# Patient Record
Sex: Male | Born: 1970 | Race: Black or African American | Hispanic: No | Marital: Married | Smoking: Never smoker
Health system: Southern US, Community
[De-identification: ages and names within clinical notes are randomized; demographics above are authoritative.]

## PROBLEM LIST (undated history)

## (undated) DIAGNOSIS — M109 Gout, unspecified: Secondary | ICD-10-CM

## (undated) DIAGNOSIS — C801 Malignant (primary) neoplasm, unspecified: Secondary | ICD-10-CM

## (undated) DIAGNOSIS — I10 Essential (primary) hypertension: Secondary | ICD-10-CM

## (undated) DIAGNOSIS — H60339 Swimmer's ear, unspecified ear: Secondary | ICD-10-CM

## (undated) HISTORY — PX: APPENDECTOMY: SHX54

## (undated) HISTORY — DX: Essential (primary) hypertension: I10

## (undated) HISTORY — PX: KELOID EXCISION: SHX1856

---

## 1998-09-13 ENCOUNTER — Emergency Department (HOSPITAL_COMMUNITY): Admission: EM | Admit: 1998-09-13 | Discharge: 1998-09-13 | Payer: Self-pay | Admitting: Emergency Medicine

## 2005-02-20 ENCOUNTER — Ambulatory Visit: Payer: Self-pay | Admitting: Internal Medicine

## 2006-01-30 ENCOUNTER — Ambulatory Visit: Payer: Self-pay | Admitting: Internal Medicine

## 2007-02-18 ENCOUNTER — Ambulatory Visit: Payer: Self-pay | Admitting: Internal Medicine

## 2007-03-28 ENCOUNTER — Emergency Department (HOSPITAL_COMMUNITY): Admission: EM | Admit: 2007-03-28 | Discharge: 2007-03-28 | Payer: Self-pay | Admitting: Emergency Medicine

## 2007-04-20 ENCOUNTER — Ambulatory Visit: Payer: Self-pay | Admitting: Internal Medicine

## 2008-02-17 DIAGNOSIS — J3089 Other allergic rhinitis: Secondary | ICD-10-CM

## 2008-02-17 DIAGNOSIS — J302 Other seasonal allergic rhinitis: Secondary | ICD-10-CM | POA: Insufficient documentation

## 2008-03-01 ENCOUNTER — Encounter: Payer: Self-pay | Admitting: Internal Medicine

## 2008-03-30 ENCOUNTER — Ambulatory Visit: Payer: Self-pay | Admitting: Internal Medicine

## 2008-03-30 DIAGNOSIS — IMO0002 Reserved for concepts with insufficient information to code with codable children: Secondary | ICD-10-CM | POA: Insufficient documentation

## 2008-04-07 ENCOUNTER — Telehealth: Payer: Self-pay | Admitting: Internal Medicine

## 2009-05-09 ENCOUNTER — Ambulatory Visit: Payer: Self-pay | Admitting: Internal Medicine

## 2009-05-18 DIAGNOSIS — I10 Essential (primary) hypertension: Secondary | ICD-10-CM | POA: Insufficient documentation

## 2010-02-20 ENCOUNTER — Telehealth: Payer: Self-pay | Admitting: Internal Medicine

## 2010-02-27 ENCOUNTER — Ambulatory Visit: Payer: Self-pay | Admitting: Internal Medicine

## 2010-03-29 ENCOUNTER — Ambulatory Visit: Payer: Self-pay | Admitting: Internal Medicine

## 2010-09-26 ENCOUNTER — Telehealth: Payer: Self-pay | Admitting: Internal Medicine

## 2010-09-27 ENCOUNTER — Telehealth: Payer: Self-pay | Admitting: Internal Medicine

## 2010-09-28 ENCOUNTER — Ambulatory Visit: Payer: Self-pay | Admitting: Internal Medicine

## 2010-12-19 ENCOUNTER — Encounter
Admission: RE | Admit: 2010-12-19 | Discharge: 2010-12-19 | Payer: Self-pay | Source: Home / Self Care | Attending: Internal Medicine | Admitting: Internal Medicine

## 2011-01-31 NOTE — Assessment & Plan Note (Signed)
Summary: rov/jd   Primary Provider/Referring Provider:  Dossie Arbour  CC:  Follow up visit-allergic Rhinitis.  History of Present Illness: 05/09/09- Allergic rhinitis Needs refill nasonex. This has  been sufficient with zyrtec during heavy pollen season. Denies headache or wheezing, purulent or bloody discharge, ear ache or sore throat. Scheduling misunerstanding today.  February 27, 2010- Allergic rhinitis He finds that running outdoors for exercise rapidly brings on nasal congestion and blowing, without chest tightness or wheeze. Eyes itch. He is using nasal saline every day- this prevents sinus infections.  He has been out of nasonex all winter.. Zyrtec makes him sleepy. He had finsihed allergy vaccine n 2008.  March 29, 2010- Allergic rhinitis Not doing as well as he hoped with nasal congestion which is worst outside and especially bothersome when he tries to run for exercise. Nasonex is being used regularly but hasn't added much. Failed Singulair.  Not using Astepro. Not taking BP med reliably- no difference on his nose.  September 28, 2010 Allergic rhinitis Doing better- rhinitis is better and he is able to do his running outdoors now. BP med was changed from [place] to [place]. He continues Nasonex and Astepro.  They are trying to get pregnant and there was concern about drying meds. He is of Zyrtec, using Astepro once daily. Wants flu vax. He does not get asthma, chest tightness or wheeze.   Preventive Screening-Counseling & Management  Alcohol-Tobacco     Smoking Status: never  Current Medications (verified): 1)  Nasonex 50 Mcg/act  Susp (Mometasone Furoate) .... Two Puffs Each Nostril Daily 2)  Zyrtec Allergy 10 Mg Tabs (Cetirizine Hcl) .Marland Kitchen.. 1 Once Daily and As Needed 3)  Norvasc 5 Mg Tabs (Amlodipine Besylate) .... Take 1 By Mouth Once Daily 4)  Astepro 0.15 % Soln (Azelastine Hcl) .Marland Kitchen.. 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   Risk Factors: Smoking Status: never (09/28/2010)  Review of Systems      See HPI       The patient complains of nasal congestion/difficulty breathing through nose and sneezing.  The patient denies shortness of breath with activity, shortness of breath at rest, productive cough, non-productive cough, coughing up blood, chest pain, irregular heartbeats, acid heartburn, indigestion, loss of appetite, weight change, abdominal pain, difficulty swallowing, sore throat, tooth/dental problems, headaches, itching, depression, hand/feet swelling, joint stiffness or pain, rash, change in color of mucus, and fever.    Vital Signs:  Patient profile:   40 year old male Height:      72 inches Weight:      225.25 pounds BMI:     30.66 O2 Sat:      98 % on Room air Pulse rate:   79 / minute BP sitting:   120 / 78  (right arm) Cuff size:   regular  Vitals Entered By: Reynaldo Minium CMA (September 28, 2010 4:02 PM)  O2 Flow:  Room air CC: Follow up visit-allergic Rhinitis   Physical Exam  Additional Exam:  General: A/Ox3; pleasant and cooperative, NAD,  SKIN: no rash, lesions NODES: no lymphadenopathy HEENT: [place]/AT, EOM- WNL, Conjuctivae- clear, PERRLA, TM-WNL, Nose- mucosa normal minor septal deviation, no polyps seen., Throat- clear  and wnl, Mallampati  II NECK: Supple w/ fair ROM, JVD- none, normal carotid impulses w/o bruits Thyroid-  CHEST: Clear to P&A HEART: RRR, no m/g/r heard ABDOMEN: Soft and nl;  ZOX:WRUE, nl pulses, no edema  NEURO: Grossly intact to observation      Impression & Recommendations:  Problem # 1:   ALLERGIC RHINITIS (ICD-477.9)  He can try reducing Astepro to once daily in alternate nostrils and skip it when not needed as suits him. This will minimize drying for now, leaving flexibility for seasonal needs.  Flu vax discussed.  His updated medication list for this problem includes:    Nasonex 50 Mcg/act Susp (Mometasone furoate) .Marland Kitchen..Marland Kitchen Two puffs each nostril daily    Zyrtec Allergy 10 Mg Tabs (Cetirizine hcl) .Marland Kitchen... 1 once daily and as needed    Astepro 0.15 % Soln (Azelastine hcl) .Marland Kitchen... 1-2 puffs up to twice daily antihistamine  Medications Added to Medication List This Visit: 1)  Norvasc 5 Mg Tabs (Amlodipine besylate) .... Take 1 by mouth once daily  Other Orders: Est. Patient Level III (45409)  Patient Instructions: 1)  Please schedule a follow-up appointment in 1 year. 2)  Astepro can be used intermittently on an as- needed basis as you find useful, or you try taking it in alternating nostrils ever other day etc, to find the lowest dose that works for you.  3)  Call for refills when needed 4)  Flu vax 5)  Samples of Astepro and Nasonex

## 2011-01-31 NOTE — Assessment & Plan Note (Signed)
Summary: rov 1 month///kp   Primary Provider/Referring Provider:  Dossie Arbour  CC:  1 month follow up visit.  History of Present Illness: History of Present Illness: 4/09- This gentleman quit allergy vaccine over a year ago.  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  He had only one cold last winter and says his breathing is comfortable when he is outside.  He continues using Nasonex nasal spray, which seemed to work well for him.  Today he feels mild nasal stuffiness with early spring weather.  We discussed available over-the-counter antihistamines like Claritin.  He has not had wheeze, cough, chest congestion, or purulent discharge.  05/09/09- Allergic rhinitis Needs refill nasonex. This has  been sufficient with zyrtec during heavy pollen season. Denies headache or wheezing, purulent or bloody discharge, ear ache or sore throat. Scheduling misunerstanding today.  February 27, 2010- Allergic rhinitis He finds that running outdoors for exercise rapidly brings on nasal congestion and blowing, without chest tightness or wheeze. Eyes itch. He is using nasal saline every day- this prevents sinus infections.  He has been out of nasonex all winter.. Zyrtec makes him sleepy. He had finsihed allergy vaccine n 2008.  March 29, 2010- Allergic rhinitis Not doing as well as he hoped with nasal congestion which is worst outside and especially bothersome when he tries to run for exercise. Nasonex is being used regularly but hasn't added much. Failed Singulair.  Not using Astepro. Not taking BP med reliably- no difference on his nose.   Current Medications (verified): 1)  Nasonex 50 Mcg/act  Susp (Mometasone Furoate) .... Two Puffs Each Nostril Daily 2)  Zyrtec Allergy 10 Mg Tabs (Cetirizine Hcl) .Marland Kitchen.. 1 Once Daily and As Needed 3)  Benazepril-Hydrochlorothiazide 20-12.5 Mg Tabs (Benazepril-Hydrochlorothiazide) .... Take 1 By Mouth Once Daily 4)  Astepro 0.15 % Soln (Azelastine Hcl) .Marland Kitchen.. 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   Risk Factors: Smoking Status: never (03/30/2008)  Review of Systems      See HPI  The patient denies anorexia, fever, weight loss, weight gain, vision loss, decreased hearing, hoarseness, chest pain, syncope, dyspnea on exertion, peripheral edema, prolonged cough, headaches, hemoptysis, and severe indigestion/heartburn.    Vital Signs:  Patient profile:   40 year old male Height:      72 inches Weight:      237.38 pounds BMI:     32.31 O2 Sat:      96 % on Room air Pulse rate:   73 / minute BP sitting:   150 / 78  (left arm) Cuff size:   large  Vitals Entered By: Reynaldo Minium CMA (March 29, 2010 9:48 AM)  O2 Flow:  Room air  Physical Exam  Additional Exam:  General: A/Ox3; pleasant and cooperative, NAD,  SKIN: no rash, lesions NODES: no lymphadenopathy HEENT: [place]/AT, EOM- WNL, Conjuctivae- clear, PERRLA, TM-WNL, Nose- mucosa reddened without eroison minor septal deviation, no polyps seen., Throat- clear and wnl, Mallampati  II NECK: Supple w/ fair ROM, JVD- none, normal carotid impulses w/o bruits Thyroid-  CHEST: Clear to P&A HEART: RRR, no m/g/r heard ABDOMEN: Soft and nl;  EAV:WUJW, nl pulses, no edema  NEURO: Grossly intact  to observation      Impression & Recommendations:  Problem # 1:  ALLERGIC RHINITIS (ICD-477.9)  Not enough anatomic blockage to expect benefit from ENT surgery. He would be a candidate to resume allergy vaccine. We discussed available alternative treatments. He can try cautious use of Afrin, maybe just once daily before  going running during this season. His updated medication list for this problem includes:    Nasonex 50 Mcg/act Susp (Mometasone furoate) .Marland Kitchen..Marland Kitchen Two puffs each nostril daily    Zyrtec Allergy 10 Mg Tabs (Cetirizine hcl) .Marland Kitchen... 1 once daily and as needed    Astepro 0.15 % Soln (Azelastine hcl) .Marland Kitchen... 1-2 puffs up to twice daily antihistamine  Other Orders: Est. Patient Level III (16109)  Patient Instructions: 1)  Please schedule a follow-up appointment in 6 months. 2)  Try cautious, occasional use of Afrin nasal spray as discussed, maybe just once daily before you go running. 3)  BP today was high- 150/78

## 2011-01-31 NOTE — Assessment & Plan Note (Signed)
Summary: George Yang   Primary Provider/Referring Provider:  Leslee Home  CC:  follow up visit-allergies are getting worse now; unable to run due to Increased allergies. .  History of Present Illness: 4/09- This gentleman quit allergy vaccine over a year ago.  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  He had only one cold last winter and says his breathing is comfortable when he is outside.  He continues using Nasonex nasal spray, which seemed to work well for him.  Today he feels mild nasal stuffiness with early spring weather.  We discussed available over-the-counter antihistamines like Claritin.  He has not had wheeze, cough, chest congestion, or purulent discharge.  05/09/09- Allergic rhinitis Needs refill nasonex. This has  been sufficient with zyrtec during heavy pollen season. Denies headache or wheezing, purulent or bloody discharge, ear ache or sore throat. Scheduling misunerstanding today.  February 27, 2010- Allergic rhinitis He finds that running outdoors for exercise rapidly brings on nasal congestion and blowing, without chest tightness or wheeze. Eyes itch. He is using nasal saline every day- this prevents sinus infections.  He has been out of nasonex all winter.. Zyrtec makes him sleepy. He had finsihed allergy vaccine n 2008.   Current Medications (verified): 1)  Nasonex 50 Mcg/act  Susp (Mometasone Furoate) .... Two Puffs Each Nostril Daily 2)  Zyrtec Allergy 10 Mg Tabs (Cetirizine Hcl) .Marland Kitchen.. 1 Once Daily and As Needed 3)  Benazepril-Hydrochlorothiazide 20-12.5 Mg Tabs (Benazepril-Hydrochlorothiazide) .... 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   Risk  Factors: Smoking Status: never (03/30/2008)  Past Surgical History: Appendectomy Keloid  Family History: Father- status unknown Mother living  Review of Systems      See HPI  The patient denies anorexia, fever, weight loss, weight gain, vision loss, decreased hearing, hoarseness, chest pain, syncope, dyspnea on exertion, peripheral edema, prolonged cough, headaches, hemoptysis, abdominal pain, and severe indigestion/heartburn.    Vital Signs:  Patient profile:   40 year old male Height:      72 inches Weight:      232.25 pounds BMI:     31.61 O2 Sat:      97 % on Room air Pulse rate:   67 / minute BP sitting:   136 / 88  (left arm) Cuff size:   regular  Vitals Entered By: Reynaldo Minium CMA (February 27, 2010 2:57 PM)  O2 Flow:  Room air  Physical Exam  Additional Exam:  General: A/Ox3; pleasant and cooperative, NAD,  SKIN: no rash, lesions NODES: no lymphadenopathy HEENT: [place]/AT, EOM- WNL, Conjuctivae- clear, PERRLA, TM-WNL, Nose- mucosa reddened without eroison, Throat- clear and wnl, Mallampati  II NECK: Supple w/ fair ROM, JVD- none, normal carotid impulses w/o bruits Thyroid-  CHEST: Clear to P&A HEART: RRR, no m/g/r heard ABDOMEN: Soft and nl;  JWJ:XBJY, nl pulses, no edema  NEURO: Grossly intact to observation      Impression & Recommendations:  Problem # 1:  ALLERGIC RHINITIS (ICD-477.9)  Rhinitis without asthma. We may need to revisit allergy vaccine.Skin testing in 1999- strong reactions for grass, trees and fall weeds. For now he wants to run otudoors in pollen season. We will try nasal antihistamine and fexofenadine otc. Samples of singulair. His updated medication list  for this problem includes:    Nasonex 50 Mcg/act Susp (Mometasone furoate) .Marland Kitchen..Marland Kitchen Two puffs each nostril daily    Zyrtec Allergy 10 Mg Tabs (Cetirizine hcl) .Marland Kitchen... 1 once daily and as needed    Astepro 0.15 % Soln (Azelastine hcl) .Marland Kitchen... 1-2 puffs up to twice daily  antihistamine  Orders: Est. Patient Level III (21308)  Medications Added to Medication List This Visit: 1)  Benazepril-hydrochlorothiazide 20-12.5 Mg Tabs (Benazepril-hydrochlorothiazide) .... Take 1 by mouth once daily 2)  Singulair 10 Mg Tabs (Montelukast sodium) .Marland Kitchen.. 1 daily 3)  Astepro 0.15 % Soln (Azelastine hcl) .Marland Kitchen.. 1-2 puffs up to twice daily antihistamine  Patient Instructions: 1)  Please schedule a follow-up appointment in 1 month. 2)  Try sample/ script Singulair- one daily for 7 days. If clearly helpful then fill script 3)  Try otc fexofenadine/ Allegra 180 mg daily as an antihistamine alternative to Zyrtec 4)  Refill nasonex- this is for daily maintenance use. 5)  Try sample/script Astepro nasal antihistamine spray: 1-2 puffs each nostril up to twice dialy as needed. Don't use it right on top of nasonex, but you can use it the same day. Prescriptions: ASTEPRO 0.15 % SOLN (AZELASTINE HCL) 1-2 puffs up to twice daily antihistamine  #1 x prn   Entered and Authorized by:   Waymon Budge MD   Signed by:   Waymon Budge MD on 02/27/2010   Method used:   Print then Give to Patient   RxID:   [id number] SINGULAIR 10 MG TABS (MONTELUKAST SODIUM) 1 daily  #30 x prn   Entered and Authorized by:   Waymon Budge MD   Signed by:   Waymon Budge MD on 02/27/2010   Method used:   Print then Give to Patient   RxID:   [id number] NASONEX 50 MCG/ACT  SUSP (MOMETASONE FUROATE) Two puffs each nostril daily  #1 x prn   Entered and Authorized by:   Waymon Budge MD   Signed by:   Waymon Budge MD on 02/27/2010   Method used:   Print then Give to Patient   RxID:   [id number]

## 2011-01-31 NOTE — Progress Notes (Signed)
Summary: note ready   Phone Note Call from Patient Call back at [phone]   Caller: Patient Call For: George Yang Summary of Call: NEED TO PURCHASE OVER THE COUNTER MEDICATION [place] FLEX PROGRAM WON'T ALLOW HIM TO DO IT . NEED A NOTE Initial call taken by: Rickard Patience,  February 20, 2010 9:33 AM  Follow-up for Phone Call        Pt has a flex spendign account which doesn't allow him to purchase OTC medications without a note from the MD. He states he takes claritin and zyrtec OTC for allergies. He states the note needs to say that he needs these medications to treat his medical problems and then he should be able to pay with his flex spending account. Please advise. Carron Curie CMA  February 20, 2010 9:48 AM   Additional Follow-up for Phone Call Additional follow up Details #1::        Letter dictated Additional Follow-up by: Waymon Budge MD,  February 20, 2010 10:11 AM    Additional Follow-up for Phone Call Additional follow up Details #2::    Letter placed in envelope and will will in triage until we get a call back from pt.  Not sure whether to mail or if pt wants to come pick up. LMOMTCB Vernie Murders  February 21, 2010 9:35 AM  pt returned call. wants this letter mailed to him. (I have verified his address). Tivis Ringer, CNA  February 21, 2010 9:39 AM  letter mailed. Carron Curie CMA  February 21, 2010 9:40 AM

## 2011-01-31 NOTE — Progress Notes (Signed)
Summary: nos appt  Phone Note Call from Patient   Caller: [email or web address]  Call For: Sander Remedios Summary of Call: LMTCB x2 to rsc nos from 9/27. Initial call taken by: Darletta Moll,  September 26, 2010 3:32 PM

## 2011-01-31 NOTE — Progress Notes (Signed)
Summary: nos appt  Phone Note Call from Patient   Caller: [email or web address]  Call For: young Summary of Call: Rsc nos from 9/27 to 9/30 @ 3:45p. Initial call taken by: Darletta Moll,  September 27, 2010 9:07 AM

## 2011-05-14 NOTE — Letter (Signed)
February 20, 2010     RE:  DELRICO, MINEHART  MRN:  [id number]  /  DOB:  Mar 19, 1971   To Whom It May Concern:   Mr. Vespa is under my care for allergies.  His medical care is  constrained some by other medical problems.  He uses over-the-counter  antihistamines, Claritin and Zyrtec, for his allergy management and  needs to be able to include these on his necessary medication list.  Please consider including these in the coverage of his Flex medication  program.    Sincerely,      Clinton D. Maple Hudson, MD, Tonny Bollman, FACP  Electronically Signed    CDY/MedQ  DD: 02/20/2010  DT: 02/20/2010  Job #: [id number]

## 2011-05-17 IMAGING — US US SCROTUM
1 series · 14 of 25 positions shown · non-contrast
Comparison: None.

CLINICAL DATA: Right-sided testicular pain.

SCROTAL ULTRASOUND
DOPPLER ULTRASOUND OF THE TESTICLES
TECHNIQUE: Complete ultrasound examination of the testicles,
epididymis, and other scrotal structures was performed.  Color and
spectral Doppler ultrasound were also utilized to evaluate blood
flow to the testicles.

[Series 1: us scrotum · 0.07mm/px · 14 of 74 slices shown]
[im 1/74]
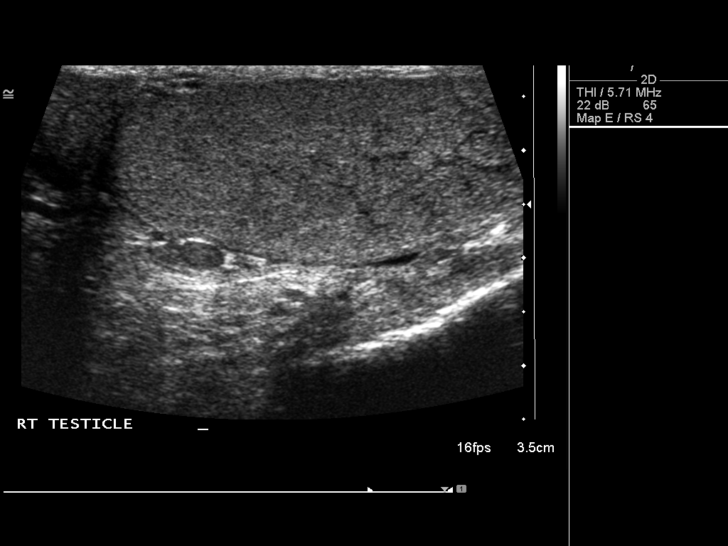
[im 7/74]
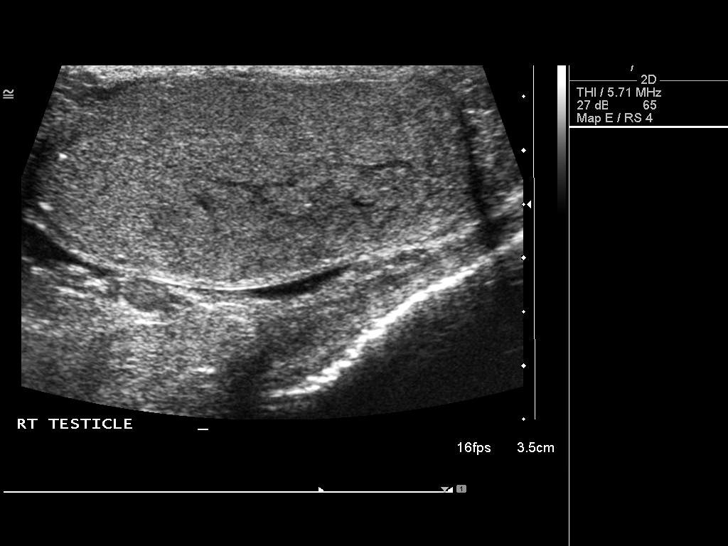
[im 13/74]
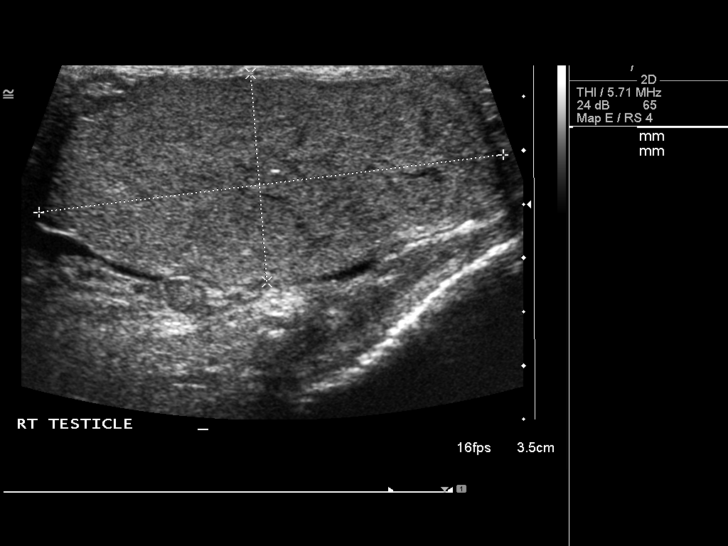
[im 19/74]
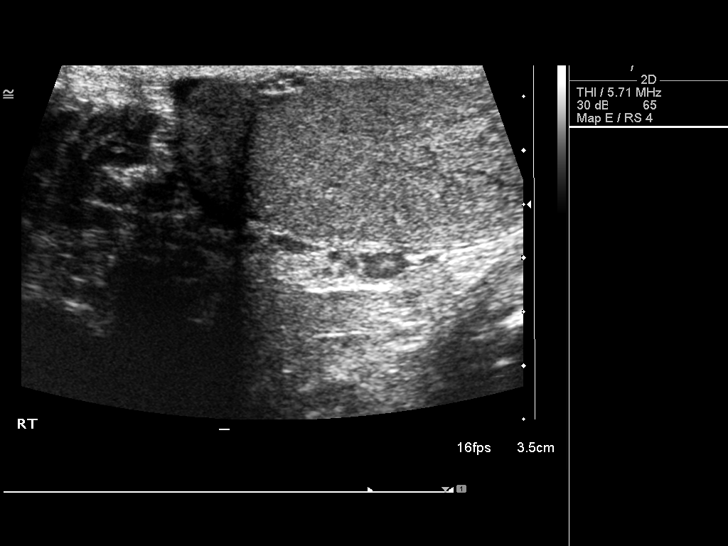
[im 25/74]
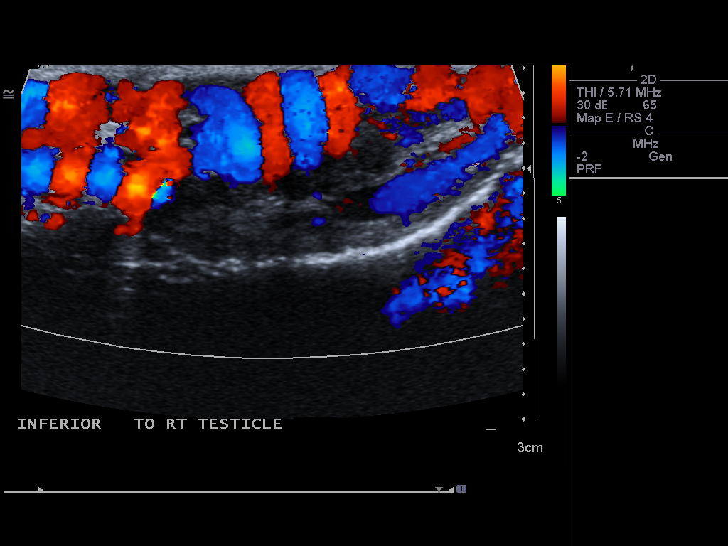
[im 28/74]
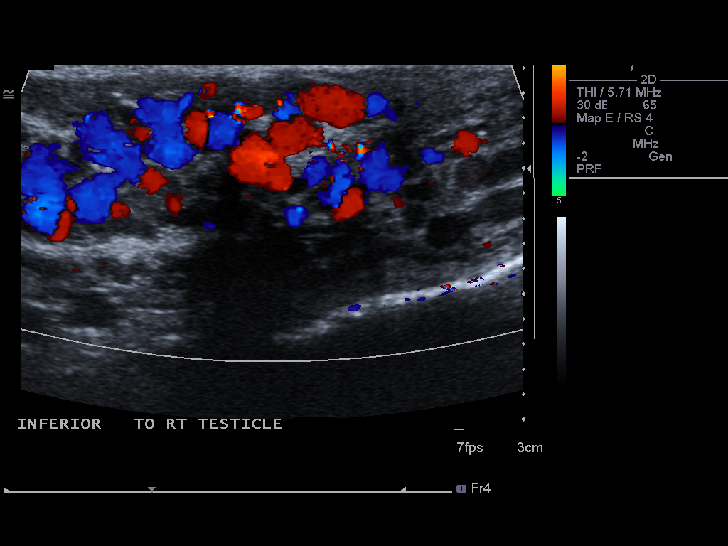
[im 34/74]
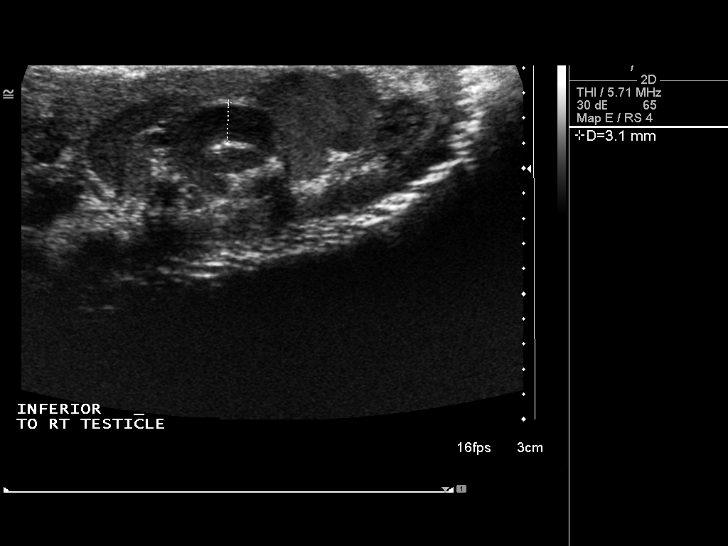
[im 40/74]
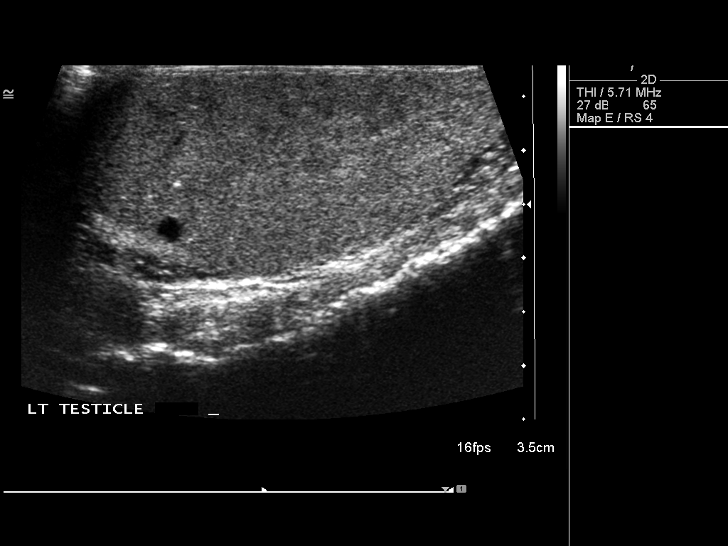
[im 46/74]
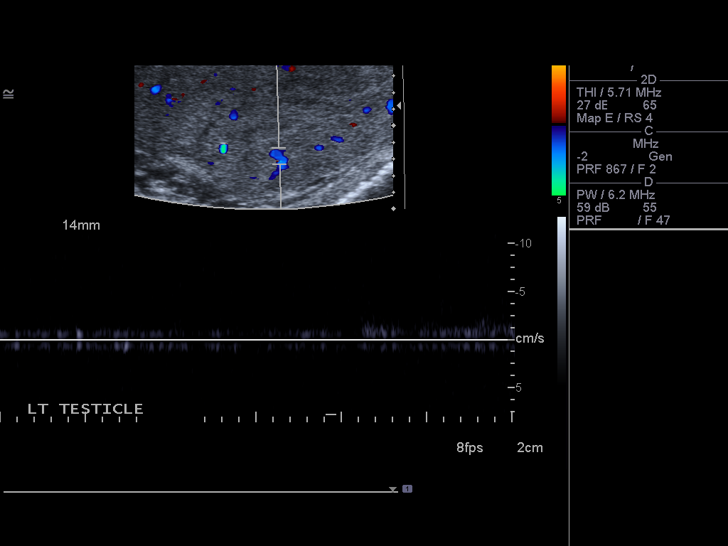
[im 49/74]
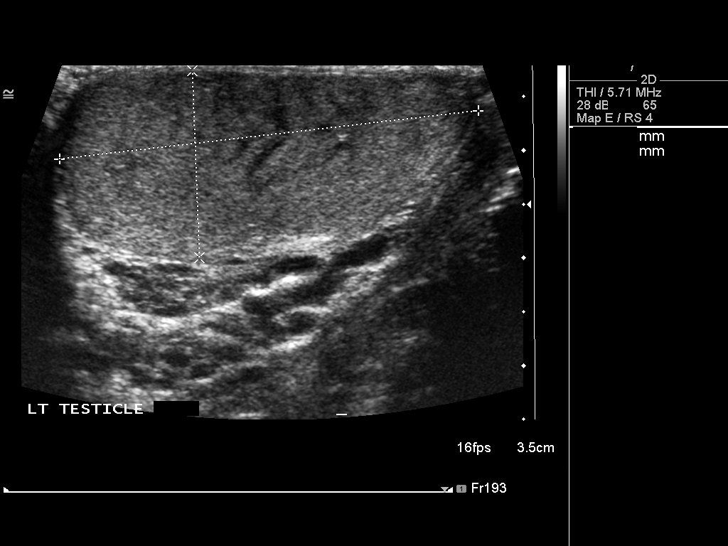
[im 55/74]
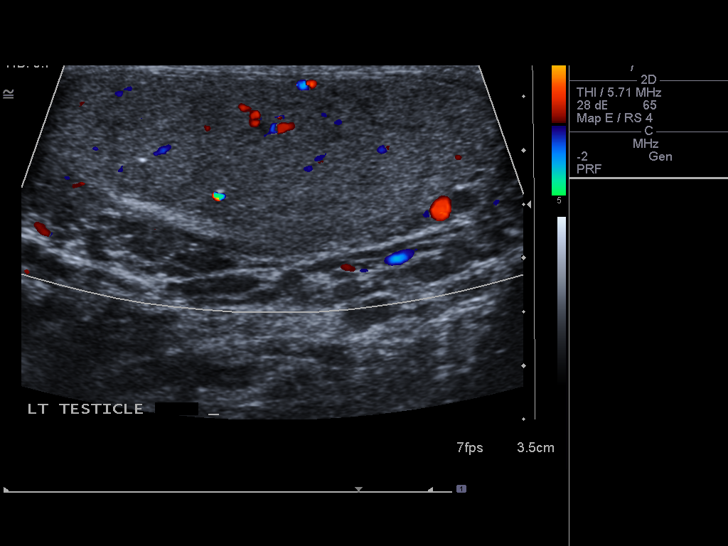
[im 61/74]
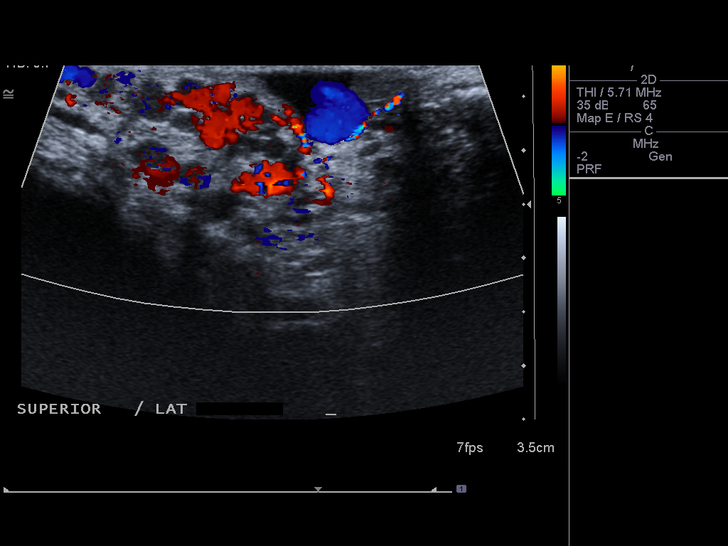
[im 67/74]
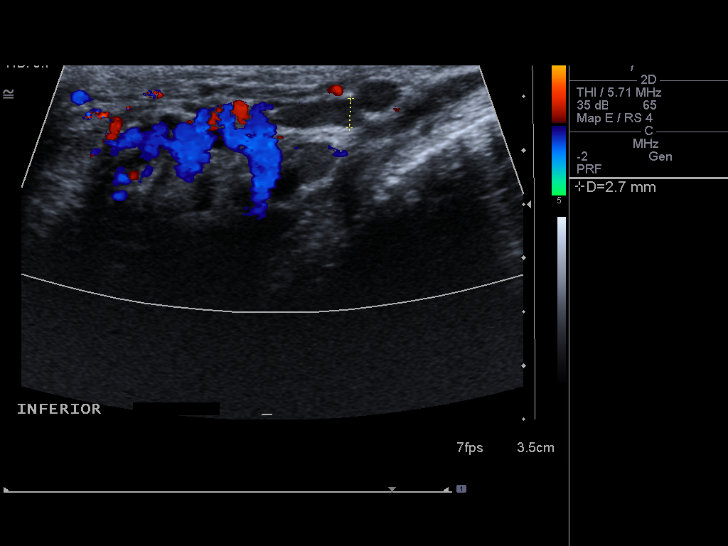
[im 74/74]
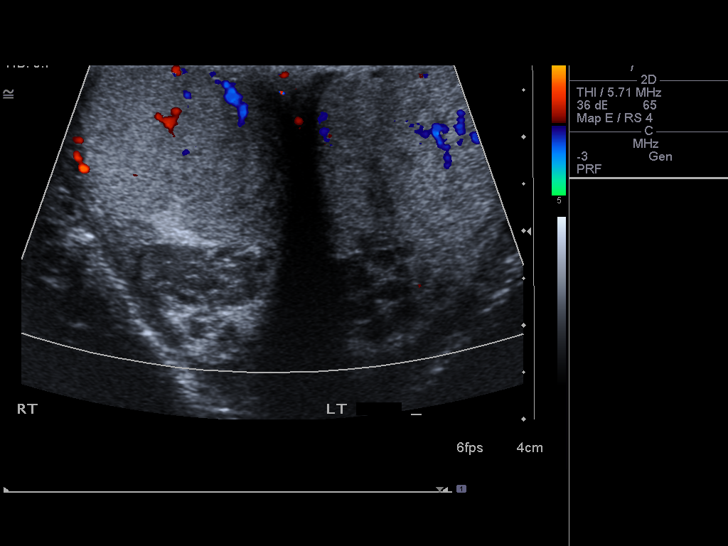

[14 of 25 positions shown; findings below may reference images not displayed]

FINDINGS: Right testicle measures 4.4 x 1.9 x 2.4 cm and left
testicle, 3.9 x 1.7 x 2.6 cm.  Parenchymal echo texture is mildly
heterogeneous bilaterally.  Color Doppler flow and arterial and
venous Doppler waveforms are identified bilaterally.  A 3 mm cyst
is seen peripherally in the left testicle, and is likely a small
tunica albuginea cyst.

Epididymi are unremarkable bilaterally.  No hydrocele.  There are
bilateral varicoceles.
IMPRESSION: 1.  Symmetric testicular parenchymal heterogeneity may be due to
edema.  No evidence of torsion or mass.  Follow-up in 4-6 weeks may
be helpful in further evaluation, as clinically indicated.
2.  Bilateral varicoceles.

## 2011-05-17 NOTE — Assessment & Plan Note (Signed)
[facility]                             PULMONARY OFFICE NOTE   GOLDEN, GILREATH                   MRN:          [id number]  DATE:02/18/2007                            DOB:          1971-06-12    PROBLEM:  Allergic rhinitis.   HISTORY:  One year followup for this non-smoking man, who says it was a  good year until he had a recent cold. He is mostly over that. Ears  occasionally itch. He does expect increased nasal congestion as the  spring season comes in and he needs his medications refilled. He does  not wheeze and has not had purulent discharge. He had quit allergy  vaccine about 4 years ago.   MEDICATIONS:  1. Benazepril.  2. Allegra 60 mg b.i.d. p.r.n.  3. Nasonex, used at intervals.   OBJECTIVE:  Weight 221 pounds, blood pressure 132/82, pulse regular 69,  room air saturation 99%. There is only mild nasal puffiness now with no  significant discharge. No post-nasal drainage. No adenopathy.  Conjunctivae are clear. Lungs are clear. Heart sounds are regular  without murmur. He does not cough and denies cough or hives. We  discussed his ACE inhibitor, which he seems to be tolerating well.   IMPRESSION:  Allergic rhinitis, currently having a good year.   PLAN:  1. Change Allegra to 180 mg 1 daily p.r.n.  2. Refill Nasonex for one spray in each nostril during the seasons of      need.  3. Return 1 year, earlier p.r.n.     Clinton D. Maple Hudson, MD, Tonny Bollman, FACP  Electronically Signed    CDY/MedQ  DD: 02/18/2007  DT: 02/19/2007  Job #: [id number]   cc:   Reuben Likes, M.D.

## 2011-05-17 NOTE — Assessment & Plan Note (Signed)
[facility]                             PULMONARY OFFICE NOTE   ROLLAN, ROGER                   MRN:          [id number]  DATE:04/20/2007                            DOB:          1971-09-17    PROBLEM:  Allergic rhinitis.   HISTORY:  Mr. Bedoy has been off of allergy vaccine now several  years.  He says he went running for exercise 4-5 days ago, and the  pollen got to him, causing marked sneezing, nasal congestion, and  drainage.  Fexofenadine is not holding, and he asks for steroid therapy  based on past experience.  He has seen nothing infected, no headache, no  wheeze.   MEDICATIONS:  1. Nasonex.  2. Benazepril.  3. Fexofenadine 180 mg.   OBJECTIVE:  VITAL SIGNS: Weight 221 pounds, pulse regular at 91.  Room  air saturation 96%.  HEENT: There is marked bilateral nasal congestion with clear bubbly  secretion, pale mucosa, slight periorbital puffiness, watery eyes.  LUNGS:  Cough without wheeze.   IMPRESSION:  Acute exacerbation of allergic rhinitis.   PLAN:  1. We discussed antihistamine alternatives including Claritin or      Zyrtec.  2. Neo-Synephrine inhalation, Depo-Medrol 80 mg IM.  3. Prednisone taper from 40 mg with steroid talk.  4. Schedule return in one year but earlier p.r.n.  5. Environmental precautions where practical.  6. Consider allergy retest p.r.n.     Clinton D. Maple Hudson, MD, Tonny Bollman, FACP  Electronically Signed    CDY/MedQ  DD: 04/26/2007  DT: 04/26/2007  Job #: [id number]   cc:   Reuben Likes, M.D.

## 2011-08-15 ENCOUNTER — Other Ambulatory Visit: Payer: Self-pay | Admitting: Internal Medicine

## 2011-08-16 ENCOUNTER — Other Ambulatory Visit: Payer: Self-pay | Admitting: *Deleted

## 2011-08-16 MED ORDER — MOMETASONE FUROATE 50 MCG/ACT NA SUSP: 2.0000 | Freq: Every day | NASAL | Status: DC

## 2011-09-25 ENCOUNTER — Encounter: Payer: Self-pay | Admitting: Internal Medicine

## 2011-09-27 ENCOUNTER — Ambulatory Visit: Payer: Self-pay | Admitting: Internal Medicine

## 2011-10-01 ENCOUNTER — Ambulatory Visit: Payer: Self-pay | Admitting: Internal Medicine

## 2011-10-02 ENCOUNTER — Ambulatory Visit (INDEPENDENT_AMBULATORY_CARE_PROVIDER_SITE_OTHER): Payer: 59 | Admitting: Internal Medicine

## 2011-10-02 ENCOUNTER — Encounter: Payer: Self-pay | Admitting: Internal Medicine

## 2011-10-02 VITALS — BP 136/82 | HR 81 | Ht 72.0 in | Wt 228.6 lb

## 2011-10-02 DIAGNOSIS — Z23 Encounter for immunization: Secondary | ICD-10-CM

## 2011-10-02 DIAGNOSIS — J309 Allergic rhinitis, unspecified: Secondary | ICD-10-CM

## 2011-10-02 MED ORDER — AZELASTINE HCL 0.15 % NA SOLN: 1.0000 | Freq: Two times a day (BID) | NASAL | Status: DC

## 2011-10-02 NOTE — Patient Instructions (Signed)
Flu vax   Astepro was refilled-sent  Please call as needed

## 2011-10-02 NOTE — Progress Notes (Signed)
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 Complicated by hypertension. Last here 09/28/2010 In December he had an episode of nasal congestion he interpreted as "allergy" but that resolved. He has been able to for exercise, which had been an issue at one point in the past. He denies sinus pain or drainage wheeze or cough. Nasonex and as to pro are sufficient, with occasional Zyrtec. He sees no reason to change. Would like flu shot.   ROS- Constitutional:   No-   weight loss, night sweats, fevers, chills, fatigue, lassitude. HEENT:   No-  headaches, difficulty swallowing, tooth/dental problems, sore throat,       No-  sneezing, itching, ear ache, little-nasal congestion, post nasal drip,  CV:  No-   chest pain, orthopnea, PND, swelling in lower extremities, anasarca, dizziness, palpitations Resp: No-   shortness of breath with exertion or at rest.              No-   productive cough,  No non-productive cough,  No-  coughing up of blood.              No-   change in color of mucus.  No- wheezing.   Skin: No-   rash or lesions. GI:  No-   heartburn, indigestion, abdominal pain, nausea, vomiting, diarrhea,                 change in bowel habits, loss of appetite GU: No-   dysuria, change in color of urine, no urgency or frequency.  No- flank pain. MS:  No-   joint pain or swelling.  No- decreased range of motion.  No- back pain. Neuro-  Psych:  No- change in mood or affect. No depression or anxiety.  No memory loss.   OBJ- General- Alert, Oriented, Affect-appropriate, Distress- none acute Skin- rash-none, lesions- none, excoriation- none Lymphadenopathy- none Head- atraumatic            Eyes- Gross vision intact, PERRLA, conjunctivae clear secretions            Ears- Hearing, canals-normal            Nose- turbinates look puffy but not obstructive, no-Septal dev, mucus, polyps, erosion, perforation             Throat-  Mallampati III , mucosa clear , drainage- none, tonsils- atrophic Neck- flexible , trachea midline, no stridor , thyroid nl, carotid no bruit Chest - symmetrical excursion , unlabored           Heart/CV- RRR , no murmur , no gallop  , no rub, nl s1 s2                           - JVD- none , edema- none, stasis changes- none, varices- none           Lung- clear to P&A, wheeze- none, cough- none , dullness-none, rub- none           Chest wall-  Abd- tender-no, distended-no, bowel sounds-present, HSM- no Br/ Gen/ Rectal- Not done, not indicated Extrem- cyanosis- none, clubbing, none, atrophy- none, strength- nl Neuro- grossly intact to observation

## 2011-10-03 ENCOUNTER — Ambulatory Visit: Payer: Self-pay | Admitting: Internal Medicine

## 2011-10-05 NOTE — Assessment & Plan Note (Signed)
Adequate control with medications and a little care for environmental precautions. He is satisfied. We're refilling current meds. Flu vaccine.

## 2012-02-25 ENCOUNTER — Other Ambulatory Visit: Payer: Self-pay | Admitting: Internal Medicine

## 2012-04-05 ENCOUNTER — Other Ambulatory Visit: Payer: Self-pay | Admitting: Internal Medicine

## 2012-04-07 ENCOUNTER — Telehealth: Payer: Self-pay | Admitting: Internal Medicine

## 2012-04-07 MED ORDER — PREDNISONE 20 MG PO TABS: 20.0000 mg | ORAL_TABLET | Freq: Every day | ORAL | Status: AC

## 2012-04-07 MED ORDER — MOMETASONE FUROATE 50 MCG/ACT NA SUSP: NASAL | Status: DC

## 2012-04-07 NOTE — Telephone Encounter (Signed)
I spoke with pt and is aware of CDY recs. He voiced his understanding and had no questions 

## 2012-04-07 NOTE — Telephone Encounter (Signed)
I spoke with pt and he c/o severe runny nose and PND x Saturday after he ran a hal of marathon, denies any cough, sob, wheezing, chest tx, f/c/n/v, no sinus pressure, ear pain. Pt is taking afrin OTC, nasonex, astepro and zyrtec daily. Pt was wanting to come in just for a depo injection and not an OV. Please advise dr. Maple Hudson, thanks  No Known Allergies

## 2012-04-07 NOTE — Telephone Encounter (Signed)
Per CY-give patient Prednisone 20 mg #5 take 1 po qd-we are not allowed to do just injection visits like that anymore. If no better pt will need to make appt to be seen for this.

## 2012-05-26 ENCOUNTER — Telehealth: Payer: Self-pay | Admitting: Internal Medicine

## 2012-05-26 MED ORDER — AZITHROMYCIN 250 MG PO TABS: ORAL_TABLET | ORAL | Status: DC

## 2012-05-26 NOTE — Telephone Encounter (Signed)
Spoke with pt. He is c/o increased chest congestion x 1 month. He has prod cough also with moderate clear sputum, sometimes has a tinge of yellow. Denies f/c/s, wheeze, CP, SOB. Please advise recs thanks! No Known Allergies

## 2012-05-26 NOTE — Telephone Encounter (Signed)
  Per CY zpak and musinex DM Pt aware and rx sent to [facility] [address]

## 2012-10-01 ENCOUNTER — Encounter: Payer: Self-pay | Admitting: Internal Medicine

## 2012-10-01 ENCOUNTER — Ambulatory Visit (INDEPENDENT_AMBULATORY_CARE_PROVIDER_SITE_OTHER): Payer: 59 | Admitting: Internal Medicine

## 2012-10-01 VITALS — BP 134/68 | HR 65 | Ht 72.0 in | Wt 220.0 lb

## 2012-10-01 DIAGNOSIS — J309 Allergic rhinitis, unspecified: Secondary | ICD-10-CM

## 2012-10-01 MED ORDER — MOMETASONE FUROATE 50 MCG/ACT NA SUSP: NASAL | Status: DC

## 2012-10-01 NOTE — Patient Instructions (Addendum)
Ok to continue present meds  Refill script sent for nasonex  Please call as needed

## 2012-10-01 NOTE — Progress Notes (Signed)
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 Complicated by hypertension. Last here 09/28/2010 In December he had an episode of nasal congestion he interpreted as "allergy" but that resolved. He has been able to for exercise, which had been an issue at one point in the past. He denies sinus pain or drainage wheeze or cough. Nasonex and as to pro are sufficient, with occasional Zyrtec. He sees no reason to change. Would like flu shot.  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 Complicated by hypertension. Has not had any flare ups lately and stays on allergy meds. Has run a half marathon since last here. Had flu vaccine. He is very satisfied with his status.  ROS- see HPI Constitutional:   No-   weight loss, night sweats, fevers, chills, fatigue, lassitude. HEENT:   No-  headaches, difficulty swallowing, tooth/dental problems, sore throat,       No-  sneezing, itching, ear ache, little-nasal congestion, post nasal drip,  CV:  No-   chest pain, orthopnea, PND, swelling in lower extremities, anasarca, dizziness, palpitations Resp: No-   shortness of breath with exertion or at rest.              No-   productive cough,  No non-productive cough,  No-  coughing up of blood.              No-   change in color of mucus.  No- wheezing.   Skin: No-   rash or lesions. GI:  No-   heartburn, indigestion, abdominal pain, nausea, vomiting, GU:  MS:  No-   joint pain or swelling.   Neuro-  Psych:  No- change in mood or affect. No depression or anxiety.  No memory loss.  OBJ- General- Alert, Oriented, Affect-appropriate, Distress- none acute Skin- rash-none, lesions- none, excoriation- none Lymphadenopathy- none Head- atraumatic            Eyes- Gross vision intact, PERRLA, conjunctivae clear secretions            Ears- Hearing, canals-normal            Nose- turbinates  look puffy but not obstructive, no-Septal dev, mucus, polyps, erosion, perforation             Throat- Mallampati III , mucosa clear , drainage- none, tonsils- atrophic Neck- flexible , trachea midline, no stridor , thyroid nl, carotid no bruit Chest - symmetrical excursion , unlabored           Heart/CV- RRR , no murmur , no gallop  , no rub, nl s1 s2                           - JVD- none , edema- none, stasis changes- none, varices- none           Lung- clear to P&A, wheeze- none, cough- none , dullness-none, rub- none           Chest wall- +wearing bullet proof vest Abd-  Br/ Gen/ Rectal- Not done, not indicated Extrem- cyanosis- none, clubbing, none, atrophy- none, strength- nl Neuro- grossly intact to observation

## 2012-10-10 NOTE — Assessment & Plan Note (Signed)
Good symptomatic control with Nasonex and antihistamines. No changes indicated now.

## 2012-12-25 ENCOUNTER — Ambulatory Visit (INDEPENDENT_AMBULATORY_CARE_PROVIDER_SITE_OTHER): Payer: 59 | Admitting: Sports Medicine

## 2012-12-25 ENCOUNTER — Encounter: Payer: Self-pay | Admitting: Sports Medicine

## 2012-12-25 VITALS — BP 128/85 | HR 77 | Ht 72.0 in | Wt 220.0 lb

## 2012-12-25 DIAGNOSIS — M2240 Chondromalacia patellae, unspecified knee: Secondary | ICD-10-CM

## 2012-12-25 NOTE — Progress Notes (Signed)
  Subjective:    Patient ID: George Yang, male    DOB: Aug 10, 1971, 41 y.o.   MRN: [id number]  HPI chief complaint: Right knee pain  Very pleasant 41 year old male comes in today complaining of several months of right knee pain. He completed a half marathon this past spring without any difficulty. Shortly thereafter he began to experience anterior knee pain particularly with running. He also notices discomfort with deep knee bending and squatting. No mechanical symptoms but he does get some popping in the anterior portion of his knee. He runs 10-15 miles per week regularly but has not run for the past 2 weeks. Denies pain in the left knee. No feelings of instability. No problems with the knees in the past. No prior knee surgeries. Symptoms improve at rest. No associated numbness or tingling.  Past medical history is well as his current medications are reviewed No known drug allergy Does not smoke, alcohol on occasional basis, works as a Emergency planning/management officer    Review of Systems     Objective:   Physical Exam Well-developed, well-nourished. No acute distress. Awake alert and oriented x3. Vital signs are reviewed.  Right knee: Full range of motion. No effusion. 3+ patellofemoral crepitus with active extension. No real pain with patellar compression test. No tenderness along the patellar tendon. Knee is stable to ligamentous exam. No joint line tenderness.  Negative McMurray's. Neurovascularly intact distally.  Left knee: Full range of motion. No effusion. 3+ patellofemoral crepitus with active extension. Knee is stable to ligamentous exam. No joint line tenderness. Negative McMurray's. Neurovascular intact distally.  Patient walks without a noticeable limp.       Assessment & Plan:  1. 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 I  think he should avoid hills when running as much as possible. Body helix patellar strap to wear with activity. Isometric quadriceps strengthening exercises to be performed daily. Patient will followup in 4 weeks. We discussed the possibility of an intra-articular cortisone injection if symptoms persist. I would also want to get x-rays to include a sunrise view to evaluate degree of patellofemoral osteoarthritis present.

## 2013-01-25 ENCOUNTER — Ambulatory Visit: Payer: 59 | Admitting: Sports Medicine

## 2013-04-14 ENCOUNTER — Telehealth: Payer: Self-pay | Admitting: Internal Medicine

## 2013-04-14 NOTE — Telephone Encounter (Signed)
We will wait for the paper fax refills or electronic refills to come to [place].

## 2013-04-23 ENCOUNTER — Other Ambulatory Visit: Payer: Self-pay | Admitting: Internal Medicine

## 2013-04-30 ENCOUNTER — Other Ambulatory Visit: Payer: Self-pay | Admitting: Internal Medicine

## 2013-05-13 ENCOUNTER — Telehealth: Payer: Self-pay | Admitting: Internal Medicine

## 2013-05-13 ENCOUNTER — Encounter: Payer: Self-pay | Admitting: Internal Medicine

## 2013-05-13 ENCOUNTER — Ambulatory Visit (INDEPENDENT_AMBULATORY_CARE_PROVIDER_SITE_OTHER): Payer: 59 | Admitting: Internal Medicine

## 2013-05-13 VITALS — BP 124/78 | HR 76 | Ht 72.0 in | Wt 217.0 lb

## 2013-05-13 DIAGNOSIS — J309 Allergic rhinitis, unspecified: Secondary | ICD-10-CM

## 2013-05-13 MED ORDER — PHENYLEPHRINE HCL 1 % NA SOLN
3.0000 [drp] | Freq: Once | NASAL | Status: AC
  Administered 2013-05-13: 3 [drp] via NASAL

## 2013-05-13 MED ORDER — METHYLPREDNISOLONE ACETATE 80 MG/ML IJ SUSP
80.0000 mg | Freq: Once | INTRAMUSCULAR | Status: AC
  Administered 2013-05-13: 80 mg via INTRAMUSCULAR

## 2013-05-13 NOTE — Telephone Encounter (Signed)
Pt c/o runny nose/congestion/sore throat.  Pt is requesting to come in for shot and neb tx.  He is not able to sleep due to all the symptoms.  CY please advise. Thanks  No Known Allergies   Last ov--10/01/2012 Next ov---10/01/2013

## 2013-05-13 NOTE — Patient Instructions (Addendum)
Neb neo nasal  Depo 80  Please call as needed 

## 2013-05-13 NOTE — Telephone Encounter (Signed)
Pt seen today by CY for depo and nasal tx. Will sign off on message.

## 2013-05-22 ENCOUNTER — Encounter: Payer: Self-pay | Admitting: Internal Medicine

## 2013-05-22 NOTE — Assessment & Plan Note (Signed)
Acute exacerbation of seasonal allergic rhinitis Plan-nasal neb decongestant, Depo-Medrol. Discussed antihistamines.

## 2013-05-22 NOTE — Progress Notes (Signed)
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 Complicated by hypertension. Last here 09/28/2010 In December he had an episode of nasal congestion he interpreted as "allergy" but that resolved. He has been able to for exercise, which had been an issue at one point in the past. He denies sinus pain or drainage wheeze or cough. Nasonex and as to pro are sufficient, with occasional Zyrtec. He sees no reason to change. Would like flu shot.  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 Complicated by hypertension. Has not had any flare ups lately and stays on allergy meds. Has run a half marathon since last here. Had flu vaccine. He is very satisfied with his status.  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 Complicated by hypertension. ACUTE VISIT: nasal treatment and depo 80 injection for acute exacerbation of seasonal rhinitis which is making it hard for him to do his job.  ROS- see HPI Constitutional:   No-   weight loss, night sweats, fevers, chills, fatigue, lassitude. HEENT:   No-  headaches, difficulty swallowing, tooth/dental problems, sore throat,       + sneezing, itching, ear ache, little-nasal congestion, post nasal drip,  CV:  No-   chest pain, orthopnea, PND, swelling in lower extremities, anasarca, dizziness, palpitations Resp: No-   shortness of breath with exertion or at rest.              No-   productive cough,  No non-productive cough,  No-  coughing up of blood.              No-   change in color of mucus.  No- wheezing.   Skin: No-   rash or lesions. GI:  No-   heartburn, indigestion, abdominal pain, nausea, vomiting, GU:  MS:  No-   joint pain or swelling.   Neuro-  Psych:  No- change in mood or affect. No depression or anxiety.  No memory loss.  OBJ- General- Alert,  Oriented, Affect-appropriate, Distress- none acute Skin- rash-none, lesions- none, excoriation- none Lymphadenopathy- none Head- atraumatic            Eyes- Gross vision intact, PERRLA, conjunctivae clear secretions            Ears- Hearing, canals-normal            Nose- turbinates +edema/ pale, no-Septal dev, mucus, polyps, erosion, perforation             Throat- Mallampati III , mucosa clear , drainage- none, tonsils- atrophic Neck- flexible , trachea midline, no stridor , thyroid nl, carotid no bruit Chest - symmetrical excursion , unlabored           Heart/CV- RRR , no murmur , no gallop  , no rub, nl s1 s2                           - JVD- none , edema- none, stasis changes- none, varices- none           Lung- clear to P&A, wheeze- none, cough- none , dullness-none, rub- none           Chest wall- +wearing bullet proof vest

## 2013-09-29 ENCOUNTER — Other Ambulatory Visit: Payer: Self-pay | Admitting: Internal Medicine

## 2013-10-01 ENCOUNTER — Ambulatory Visit: Payer: 59 | Admitting: Internal Medicine

## 2013-10-14 ENCOUNTER — Ambulatory Visit (INDEPENDENT_AMBULATORY_CARE_PROVIDER_SITE_OTHER): Payer: 59 | Admitting: Internal Medicine

## 2013-10-14 ENCOUNTER — Encounter: Payer: Self-pay | Admitting: Internal Medicine

## 2013-10-14 VITALS — BP 118/64 | HR 83 | Ht 72.0 in | Wt 217.0 lb

## 2013-10-14 DIAGNOSIS — J302 Other seasonal allergic rhinitis: Secondary | ICD-10-CM

## 2013-10-14 DIAGNOSIS — J309 Allergic rhinitis, unspecified: Secondary | ICD-10-CM

## 2013-10-14 MED ORDER — FLUTICASONE PROPIONATE 50 MCG/ACT NA SUSP: 2.0000 | Freq: Every day | NASAL | Status: DC

## 2013-10-14 NOTE — Patient Instructions (Signed)
As replacement for Nasonex- given script for Flonase/ fluticasone nasal spray or as another alternative, you could use otc Nasacort  Please call as needed

## 2013-10-14 NOTE — Progress Notes (Signed)
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 Complicated by hypertension. Last here 09/28/2010 In December he had an episode of nasal congestion he interpreted as "allergy" but that resolved. He has been able to for exercise, which had been an issue at one point in the past. He denies sinus pain or drainage wheeze or cough. Nasonex and as to pro are sufficient, with occasional Zyrtec. He sees no reason to change. Would like flu shot.  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 Complicated by hypertension. Has not had any flare ups lately and stays on allergy meds. Has run a half marathon since last here. Had flu vaccine. He is very satisfied with his status.  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 Complicated by hypertension. ACUTE VISIT: nasal treatment and depo 80 injection for acute exacerbation of seasonal rhinitis which is making it hard for him to do his job.  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 Complicated by hypertension. FOLLOWS GNF:AOZHY well, insurance will not cover nasonex any more No problems.  ROS- see HPI Constitutional:   No-   weight loss, night sweats, fevers, chills, fatigue, lassitude. HEENT:   No-  headaches, difficulty swallowing, tooth/dental problems, sore throat,       No- sneezing, itching, ear ache, +little-nasal congestion, post nasal drip,  CV:  No-   chest pain, orthopnea, PND, swelling in lower extremities, anasarca, dizziness, palpitations Resp: No-   shortness of breath with exertion or at rest.              No-   productive cough,  No non-productive cough,  No-  coughing up of blood.              No-   change in color of mucus.  No- wheezing.   Skin: No-   rash or lesions. GI:   No-   heartburn, indigestion, abdominal pain, nausea, vomiting, GU:  MS:  No-   joint pain or swelling.   Neuro-  Psych:  No- change in mood or affect. No depression or anxiety.  No memory loss.  OBJ- General- Alert, Oriented, Affect-appropriate, Distress- none acute Skin- rash-none, lesions- none, excoriation- none Lymphadenopathy- none Head- atraumatic            Eyes- Gross vision intact, PERRLA, conjunctivae clear secretions            Ears- Hearing, canals-normal            Nose- turbinates +edema/ pale, no-Septal dev, mucus, polyps, erosion, perforation             Throat- Mallampati III , mucosa clear , drainage- none, tonsils- atrophic Neck- flexible , trachea midline, no stridor , thyroid nl, carotid no bruit Chest - symmetrical excursion , unlabored           Heart/CV- RRR , no murmur , no gallop  , no rub, nl s1 s2                           - JVD- none , edema- none, stasis changes- none, varices- none           Lung- clear to P&A, wheeze- none, cough- none , dullness-none, rub- none           Chest wall- +wearing bullet proof vest Abd- GU-  MSkel- no evident problem Neuro- grossly normal

## 2013-10-30 NOTE — Assessment & Plan Note (Signed)
Fair control. Try changing Nasonex to cheaper Flonase or Nasacort

## 2013-12-29 ENCOUNTER — Other Ambulatory Visit: Payer: Self-pay | Admitting: Internal Medicine

## 2013-12-30 ENCOUNTER — Other Ambulatory Visit: Payer: Self-pay | Admitting: Internal Medicine

## 2014-10-14 ENCOUNTER — Encounter: Payer: Self-pay | Admitting: Internal Medicine

## 2014-10-14 ENCOUNTER — Ambulatory Visit (INDEPENDENT_AMBULATORY_CARE_PROVIDER_SITE_OTHER): Payer: 59 | Admitting: Internal Medicine

## 2014-10-14 ENCOUNTER — Encounter (INDEPENDENT_AMBULATORY_CARE_PROVIDER_SITE_OTHER): Payer: Self-pay

## 2014-10-14 VITALS — BP 118/70 | HR 75 | Ht 72.0 in | Wt 225.0 lb

## 2014-10-14 DIAGNOSIS — J309 Allergic rhinitis, unspecified: Secondary | ICD-10-CM

## 2014-10-14 DIAGNOSIS — J3089 Other allergic rhinitis: Principal | ICD-10-CM

## 2014-10-14 DIAGNOSIS — J302 Other seasonal allergic rhinitis: Secondary | ICD-10-CM

## 2014-10-14 MED ORDER — CETIRIZINE HCL 10 MG PO TABS: ORAL_TABLET | ORAL | Status: AC

## 2014-10-14 MED ORDER — FLUTICASONE PROPIONATE 50 MCG/ACT NA SUSP: 2.0000 | Freq: Every day | NASAL | Status: DC

## 2014-10-14 NOTE — Patient Instructions (Addendum)
Scripts sent refilling flonase and cetirizine  Please call as needed

## 2014-10-14 NOTE — Progress Notes (Signed)
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 Complicated by hypertension. Last here 09/28/2010 In December he had an episode of nasal congestion he interpreted as "allergy" but that resolved. He has been able to for exercise, which had been an issue at one point in the past. He denies sinus pain or drainage wheeze or cough. Nasonex and as to pro are sufficient, with occasional Zyrtec. He sees no reason to change. Would like flu shot.  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 Complicated by hypertension. Has not had any flare ups lately and stays on allergy meds. Has run a half marathon since last here. Had flu vaccine. He is very satisfied with his status.  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 Complicated by hypertension. ACUTE VISIT: nasal treatment and depo 80 injection for acute exacerbation of seasonal rhinitis which is making it hard for him to do his job.  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 Complicated by hypertension. FOLLOWS IHK:VQQVZ well, insurance will not cover nasonex any more No problems.  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 Complicated by hypertension. FOLLOWS DGL:OVFIEPPIR his allergy meds all year round and has no complaints today. Not running much. Credits daily Zyrtec and Flonase for good allergy control. No nose bleeds. No chest tightness or wheeze.  ROS- see HPI Constitutional:   No-   weight loss, night sweats, fevers, chills, fatigue, lassitude. HEENT:   No-  headaches, difficulty swallowing, tooth/dental problems, sore throat,       No- sneezing, itching, ear ache, +little-nasal congestion, post nasal drip,   CV:  No-   chest pain, orthopnea, PND, swelling in lower extremities, anasarca, dizziness, palpitations Resp: No-   shortness of breath with exertion or at rest.              No-   productive cough,  No non-productive cough,  No-  coughing up of blood.              No-   change in color of mucus.  No- wheezing.   Skin: No-   rash or lesions. GI:  No-   heartburn, indigestion, abdominal pain, nausea, vomiting, GU:  MS:  No-   joint pain or swelling.   Neuro-  Psych:  No- change in mood or affect. No depression or anxiety.  No memory loss.  OBJ- General- Alert, Oriented, Affect-appropriate, Distress- none acute Skin- rash-none, lesions- none, excoriation- none Lymphadenopathy- none Head- atraumatic            Eyes- Gross vision intact, PERRLA, conjunctivae clear secretions            Ears- Hearing, canals-normal            Nose- turbinates normal, no-Septal dev, mucus, polyps, erosion, perforation             Throat- Mallampati III , mucosa clear , drainage- none, tonsils- atrophic Neck- flexible , trachea midline, no stridor , thyroid nl, carotid no bruit Chest - symmetrical excursion , unlabored           Heart/CV- RRR , no murmur , no gallop  , no rub, nl s1 s2                           -  JVD- none , edema- none, stasis changes- none, varices- none           Lung- clear to P&A, wheeze- none, cough- none , dullness-none, rub- none           Chest wall-  Abd- GU- MSkel- no evident problem Neuro- grossly normal

## 2014-10-14 NOTE — Assessment & Plan Note (Signed)
I suggested he try dropping off nasal spray and antihistamine occasionally, to see if he needs to stay on them

## 2014-12-09 ENCOUNTER — Other Ambulatory Visit: Payer: Self-pay | Admitting: Internal Medicine

## 2015-07-10 ENCOUNTER — Encounter: Payer: Self-pay | Admitting: Family Medicine

## 2015-07-10 ENCOUNTER — Ambulatory Visit (INDEPENDENT_AMBULATORY_CARE_PROVIDER_SITE_OTHER): Payer: Commercial Managed Care - HMO | Admitting: Family Medicine

## 2015-07-10 VITALS — BP 153/95 | HR 83 | Ht 72.0 in | Wt 220.0 lb

## 2015-07-10 DIAGNOSIS — M25512 Pain in left shoulder: Secondary | ICD-10-CM | POA: Diagnosis not present

## 2015-07-10 MED ORDER — METHYLPREDNISOLONE ACETATE 40 MG/ML IJ SUSP
40.0000 mg | Freq: Once | INTRAMUSCULAR | Status: AC
  Administered 2015-07-10: 40 mg via INTRA_ARTICULAR

## 2015-07-10 NOTE — Progress Notes (Signed)
Procedure Note  INJECTION: Patient was given informed consent, signed copy in the chart. Appropriate time out was taken. Area prepped and draped in usual sterile fashion.  1cc of methylprednisolone 40 mg/ml plus  4cc of 1% lidocaine without epinephrine was injected into the subacromial space. The patient tolerated the procedure well. There were no complications. Post procedure instructions were given.  Luiz Blare, DO 07/10/2015, 5:28 PM PGY-2, [place]

## 2015-07-10 NOTE — Progress Notes (Signed)
  George Yang - 44 y.o. male MRN [id number]  Date of birth: 1971/03/02  SUBJECTIVE:  Including CC & ROS.  CC: L neck/shoulder pain  HPI: Patient complaining of L sided neck pain with radiation to shoulder for the last 3 weeks. Says pain worse with movement. Describes it as a sharp pain. States that he thinks he may have injured it when doing pull-ups at the gym. He says he continued to lift despite the pain. He went to an Urgent Care and they told him he pulled a muscle. He was prescribed Mobic and Flexeril. Gets some improvement from medications. Heat also helps with pain. Pain wakes him up in middle of night. There has been no improvement.   Review of Systems  Constitutional: Negative for fever and chills.  Respiratory: Negative for shortness of breath.   Cardiovascular: Negative for chest pain.  Gastrointestinal: Negative for nausea and vomiting.  Musculoskeletal: Positive for neck pain.  Neurological: Negative for focal weakness and headaches.   HISTORY: Past Medical, Surgical, Social, and Family History Reviewed & Updated per EMR.   Pertinent Historical Findings include: Non-smoker   PHYSICAL EXAM:  BP 153/95 mmHg  Pulse 83  Ht 6' (1.829 m)  Wt 220 lb (99.791 kg)  BMI 29.83 kg/m2  General: alert, well-developed, NAD, cooperative Msk: no joint swelling, no joint warmth, and no redness over joints. Range of motion normal. Pain illicited with Apley test of S. shoulder.Tone normal.  Pulses: intact Neurologic: +5 strength globally, sensation grossly intact, A&Ox3.  Skin: Intact without suspicious lesions or rashes. Warm and dry. Psych: Mood and affect are normal   ASSESSMENT & PLAN:   A: Shoulder pain. Possible axillary nerve injury given the distribution of pain. Also cannot r/o shoulder etiology such as impingement.                                                                                            P: - Received steroid injection into subacromial space - F/u in 4  weeks if symtpoms not improved - continue conservative treatment with prn medications and heat   Luiz Blare, DO 07/10/2015, 5:22 PM PGY-2, [place]

## 2015-07-11 NOTE — Progress Notes (Signed)
Patient ID: George Yang, male   DOB: 1971-08-26, 44 y.o.   MRN: [id number] [place] Attending Note: I have seen and examined this patient. I have discussed this patient with the resident and reviewed the assessment and plan as documented above. I agree with the resident's findings and plan. I was present for and participated in supervision of teh injection. I think he has irritated the shoulder doing some weight lifting, we will try CSI and if  That does not calm it down wouldconsider further inagin. I get no sense this is a rotator cuff tear. He may have a contribution from neck pathology so next imaging would include C spine wit obliques.

## 2015-07-17 ENCOUNTER — Ambulatory Visit: Payer: Self-pay | Admitting: Sports Medicine

## 2015-08-10 ENCOUNTER — Telehealth: Payer: Self-pay | Admitting: Internal Medicine

## 2015-08-10 DIAGNOSIS — H6692 Otitis media, unspecified, left ear: Secondary | ICD-10-CM

## 2015-08-10 DIAGNOSIS — J302 Other seasonal allergic rhinitis: Secondary | ICD-10-CM

## 2015-08-10 DIAGNOSIS — J3089 Other allergic rhinitis: Secondary | ICD-10-CM

## 2015-08-10 NOTE — Telephone Encounter (Signed)
Pt aware that referral for ENT appt is being placed.  Will send to [facility] to ensure this gets scheduled ASAP.  Nothing further needed.

## 2015-08-10 NOTE — Telephone Encounter (Signed)
Spoke with pt, c/o L ear infection X1 month- stopped up, throbbing.  PCP noticed wax build up and scarring on ear drum.  Ear is draining.   PCP gave him doxycycline and ear drops, pt does not know name of them.   Pt requesting appt tomorrow morning.  I advised that [facility] is booked up.   Pt uses [facility] on [address].    [facility] please advise on recs.  Thanks!  Last ov:  10/14/14 Next ov: 10/20/2015  No Known Allergies Current Outpatient Prescriptions on File Prior to Visit  Medication Sig Dispense Refill  . amLODipine (NORVASC) 5 MG tablet Take 1 tablet by mouth daily.    . benzonatate (TESSALON) 100 MG capsule TK 1 C PO TID PRF COUGH  3  . cetirizine (ZYRTEC) 10 MG tablet TAKE 1 TABLET BY MOUTH EVERY DAY 90 tablet 3  . cyclobenzaprine (FLEXERIL) 10 MG tablet TK 1 T PO BID PRN  0  . fluticasone (FLONASE) 50 MCG/ACT nasal spray PLACE 2 SPRAYS INTO THE NOSE EVERY DAY. 16 g PRN  . ipratropium (ATROVENT) 0.06 % nasal spray U 1 TO 2 SPRAYS IEN UP TO TID PRN  3  . losartan (COZAAR) 100 MG tablet Take 1 tablet by mouth daily.    . meloxicam (MOBIC) 15 MG tablet TK 1 T PO QD PRN  0   No current facility-administered medications on file prior to visit.

## 2015-08-10 NOTE — Telephone Encounter (Signed)
Per CY-please let patient know that he would benefit seeing an ENT at this point. Please place referral to ENT ASAP appt needed. Thanks.

## 2015-08-11 NOTE — Telephone Encounter (Signed)
Appt made for 8/15 at 10:50 with Dr Wilburn Cornelia, pt aware of appt & was given phone # in case he needs to reschedule. Records faxed/spm [place]

## 2015-10-20 ENCOUNTER — Ambulatory Visit: Payer: Commercial Managed Care - HMO | Admitting: Internal Medicine

## 2017-02-20 DIAGNOSIS — H3321 Serous retinal detachment, right eye: Secondary | ICD-10-CM | POA: Diagnosis not present

## 2017-03-11 DIAGNOSIS — L910 Hypertrophic scar: Secondary | ICD-10-CM | POA: Diagnosis not present

## 2017-05-20 DIAGNOSIS — H3321 Serous retinal detachment, right eye: Secondary | ICD-10-CM | POA: Diagnosis not present

## 2017-05-22 DIAGNOSIS — Z0000 Encounter for general adult medical examination without abnormal findings: Secondary | ICD-10-CM | POA: Diagnosis not present

## 2017-05-29 DIAGNOSIS — Z0000 Encounter for general adult medical examination without abnormal findings: Secondary | ICD-10-CM | POA: Diagnosis not present

## 2017-05-29 DIAGNOSIS — H60502 Unspecified acute noninfective otitis externa, left ear: Secondary | ICD-10-CM | POA: Diagnosis not present

## 2017-05-29 DIAGNOSIS — E784 Other hyperlipidemia: Secondary | ICD-10-CM | POA: Diagnosis not present

## 2017-05-29 DIAGNOSIS — Z1389 Encounter for screening for other disorder: Secondary | ICD-10-CM | POA: Diagnosis not present

## 2017-05-29 DIAGNOSIS — R972 Elevated prostate specific antigen [PSA]: Secondary | ICD-10-CM | POA: Diagnosis not present

## 2017-07-14 DIAGNOSIS — L910 Hypertrophic scar: Secondary | ICD-10-CM | POA: Diagnosis not present

## 2017-08-04 DIAGNOSIS — R972 Elevated prostate specific antigen [PSA]: Secondary | ICD-10-CM | POA: Diagnosis not present

## 2017-09-25 DIAGNOSIS — Z23 Encounter for immunization: Secondary | ICD-10-CM | POA: Diagnosis not present

## 2017-09-26 DIAGNOSIS — Z23 Encounter for immunization: Secondary | ICD-10-CM | POA: Diagnosis not present

## 2017-11-03 DIAGNOSIS — R972 Elevated prostate specific antigen [PSA]: Secondary | ICD-10-CM | POA: Diagnosis not present

## 2017-11-10 DIAGNOSIS — R972 Elevated prostate specific antigen [PSA]: Secondary | ICD-10-CM | POA: Diagnosis not present

## 2017-11-10 DIAGNOSIS — N400 Enlarged prostate without lower urinary tract symptoms: Secondary | ICD-10-CM | POA: Diagnosis not present

## 2017-11-27 DIAGNOSIS — C61 Malignant neoplasm of prostate: Secondary | ICD-10-CM | POA: Diagnosis not present

## 2017-11-27 DIAGNOSIS — R972 Elevated prostate specific antigen [PSA]: Secondary | ICD-10-CM | POA: Diagnosis not present

## 2017-11-29 HISTORY — PX: PROSTATE BIOPSY: SHX241

## 2017-12-15 DIAGNOSIS — C61 Malignant neoplasm of prostate: Secondary | ICD-10-CM | POA: Diagnosis not present

## 2017-12-18 ENCOUNTER — Other Ambulatory Visit: Payer: Self-pay | Admitting: Urology

## 2017-12-18 DIAGNOSIS — M10072 Idiopathic gout, left ankle and foot: Secondary | ICD-10-CM | POA: Diagnosis not present

## 2018-01-02 ENCOUNTER — Encounter (HOSPITAL_COMMUNITY): Payer: Self-pay | Admitting: *Deleted

## 2018-01-05 DIAGNOSIS — M10072 Idiopathic gout, left ankle and foot: Secondary | ICD-10-CM | POA: Diagnosis not present

## 2018-01-27 DIAGNOSIS — C61 Malignant neoplasm of prostate: Secondary | ICD-10-CM | POA: Diagnosis not present

## 2018-01-27 NOTE — Patient Instructions (Addendum)
George Yang  01/27/2018   Your procedure is scheduled on: 01-30-18   Report to [facility] Main  Entrance   Follow signs to Short Stay on first floor at 530 AM    Call this number if you have problems the morning of surgery [phone]     Remember: Do not eat food or drink liquids :After Midnight.     Take these medicines the morning of surgery with A SIP OF WATER: amlodipine, allopurinol, cetirizine(zyrtec), nasal spray if needed, colchicine if needed                                You may not have any metal on your body including hair pins and              piercings  Do not wear jewelry, make-up, lotions, powders or perfumes, deodorant                 Men may shave face and neck.   Do not bring valuables to the hospital. [place].  Contacts, dentures or bridgework may not be worn into surgery.  Leave suitcase in the car. After surgery it may be brought to your room.                 Please read over the following fact sheets you were given: _____________________________________________________________________             [facility] - Preparing for Surgery Before surgery, you can play an important role.  Because skin is not sterile, your skin needs to be as free of germs as possible.  You can reduce the number of germs on your skin by washing with CHG (chlorahexidine gluconate) soap before surgery.  CHG is an antiseptic cleaner which kills germs and bonds with the skin to continue killing germs even after washing. Please DO NOT use if you have an allergy to CHG or antibacterial soaps.  If your skin becomes reddened/irritated stop using the CHG and inform your nurse when you arrive at Short Stay. Do not shave (including legs and underarms) for at least 48 hours prior to the first CHG shower.  You may shave your face/neck. Please follow these instructions carefully:  1.  Shower with CHG Soap the  night before surgery and the  morning of Surgery.  2.  If you choose to wash your hair, wash your hair first as usual with your  normal  shampoo.  3.  After you shampoo, rinse your hair and body thoroughly to remove the  shampoo.                           4.  Use CHG as you would any other liquid soap.  You can apply chg directly  to the skin and wash                       Gently with a scrungie or clean washcloth.  5.  Apply the CHG Soap to your body ONLY FROM THE NECK DOWN.   Do not use on face/ open  Wound or open sores. Avoid contact with eyes, ears mouth and genitals (private parts).                       Wash face,  Genitals (private parts) with your normal soap.             6.  Wash thoroughly, paying special attention to the area where your surgery  will be performed.  7.  Thoroughly rinse your body with warm water from the neck down.  8.  DO NOT shower/wash with your normal soap after using and rinsing off  the CHG Soap.                9.  Pat yourself dry with a clean towel.            10.  Wear clean pajamas.            11.  Place clean sheets on your bed the night of your first shower and do not  sleep with pets. Day of Surgery : Do not apply any lotions/deodorants the morning of surgery.  Please wear clean clothes to the hospital/surgery center.  FAILURE TO FOLLOW THESE INSTRUCTIONS MAY RESULT IN THE CANCELLATION OF YOUR SURGERY PATIENT SIGNATURE_________________________________  NURSE SIGNATURE__________________________________  ________________________________________________________________________  WHAT IS A BLOOD TRANSFUSION? Blood Transfusion Information  A transfusion is the replacement of blood or some of its parts. Blood is made up of multiple cells which provide different functions.  Red blood cells carry oxygen and are used for blood loss replacement.  White blood cells fight against infection.  Platelets control bleeding.  Plasma  helps clot blood.  Other blood products are available for specialized needs, such as hemophilia or other clotting disorders. BEFORE THE TRANSFUSION  Who gives blood for transfusions?   Healthy volunteers who are fully evaluated to make sure their blood is safe. This is blood bank blood. Transfusion therapy is the safest it has ever been in the practice of medicine. Before blood is taken from a donor, a complete history is taken to make sure that person has no history of diseases nor engages in risky social behavior (examples are intravenous drug use or sexual activity with multiple partners). The donor's travel history is screened to minimize risk of transmitting infections, such as malaria. The donated blood is tested for signs of infectious diseases, such as HIV and hepatitis. The blood is then tested to be sure it is compatible with you in order to minimize the chance of a transfusion reaction. If you or a relative donates blood, this is often done in anticipation of surgery and is not appropriate for emergency situations. It takes many days to process the donated blood. RISKS AND COMPLICATIONS Although transfusion therapy is very safe and saves many lives, the main dangers of transfusion include:   Getting an infectious disease.  Developing a transfusion reaction. This is an allergic reaction to something in the blood you were given. Every precaution is taken to prevent this. The decision to have a blood transfusion has been considered carefully by your caregiver before blood is given. Blood is not given unless the benefits outweigh the risks. AFTER THE TRANSFUSION  Right after receiving a blood transfusion, you will usually feel much better and more energetic. This is especially true if your red blood cells have gotten low (anemic). The transfusion raises the level of the red blood cells which carry oxygen, and this usually causes an energy increase.  The  nurse administering the transfusion  will monitor you carefully for complications. HOME CARE INSTRUCTIONS  No special instructions are needed after a transfusion. You may find your energy is better. Speak with your caregiver about any limitations on activity for underlying diseases you may have. SEEK MEDICAL CARE IF:   Your condition is not improving after your transfusion.  You develop redness or irritation at the intravenous (IV) site. SEEK IMMEDIATE MEDICAL CARE IF:  Any of the following symptoms occur over the next 12 hours:  Shaking chills.  You have a temperature by mouth above 102 F (38.9 C), not controlled by medicine.  Chest, back, or muscle pain.  People around you feel you are not acting correctly or are confused.  Shortness of breath or difficulty breathing.  Dizziness and fainting.  You get a rash or develop hives.  You have a decrease in urine output.  Your urine turns a dark color or changes to pink, red, or brown. Any of the following symptoms occur over the next 10 days:  You have a temperature by mouth above 102 F (38.9 C), not controlled by medicine.  Shortness of breath.  Weakness after normal activity.  The white part of the eye turns yellow (jaundice).  You have a decrease in the amount of urine or are urinating less often.  Your urine turns a dark color or changes to pink, red, or brown. Document Released: 12/13/2000 Document Revised: 03/09/2012 Document Reviewed: 08/01/2008 [facility] Patient Information 2014 [place], [place].  _______________________________________________________________________

## 2018-01-28 ENCOUNTER — Encounter (HOSPITAL_COMMUNITY)
Admission: RE | Admit: 2018-01-28 | Discharge: 2018-01-28 | Disposition: A | Discharge status: Home / Self Care | Payer: 59 | Source: Ambulatory Visit | Attending: Urology | Admitting: Urology

## 2018-01-28 ENCOUNTER — Other Ambulatory Visit: Payer: Self-pay

## 2018-01-28 ENCOUNTER — Encounter (HOSPITAL_COMMUNITY): Payer: Self-pay

## 2018-01-28 DIAGNOSIS — I10 Essential (primary) hypertension: Secondary | ICD-10-CM | POA: Diagnosis not present

## 2018-01-28 DIAGNOSIS — C61 Malignant neoplasm of prostate: Secondary | ICD-10-CM | POA: Diagnosis not present

## 2018-01-28 DIAGNOSIS — Z79899 Other long term (current) drug therapy: Secondary | ICD-10-CM | POA: Diagnosis not present

## 2018-01-28 DIAGNOSIS — M6281 Muscle weakness (generalized): Secondary | ICD-10-CM | POA: Diagnosis not present

## 2018-01-28 DIAGNOSIS — M6289 Other specified disorders of muscle: Secondary | ICD-10-CM | POA: Diagnosis not present

## 2018-01-28 HISTORY — DX: Gout, unspecified: M10.9

## 2018-01-28 HISTORY — DX: Swimmer's ear, unspecified ear: H60.339

## 2018-01-28 HISTORY — DX: Malignant (primary) neoplasm, unspecified: C80.1

## 2018-01-28 LAB — COMPREHENSIVE METABOLIC PANEL
ALK PHOS: 62 U/L (ref 38–126)
ALT: 20 U/L (ref 17–63)
ANION GAP: 7 (ref 5–15)
AST: 19 U/L (ref 15–41)
Albumin: 4.2 g/dL (ref 3.5–5.0)
BILIRUBIN TOTAL: 0.6 mg/dL (ref 0.3–1.2)
BUN: 13 mg/dL (ref 6–20)
CALCIUM: 9.2 mg/dL (ref 8.9–10.3)
CO2: 27 mmol/L (ref 22–32)
Chloride: 105 mmol/L (ref 101–111)
Creatinine, Ser: 1.07 mg/dL (ref 0.61–1.24)
GFR calc Af Amer: >60 mL/min (ref >60)
Glucose, Bld: 91 mg/dL (ref 65–99)
POTASSIUM: 4 mmol/L (ref 3.5–5.1)
Sodium: 139 mmol/L (ref 135–145)
TOTAL PROTEIN: 7.9 g/dL (ref 6.5–8.1)

## 2018-01-28 LAB — CBC
HCT: 45.2 % (ref 39.0–52.0)
Hemoglobin: 14.6 g/dL (ref 13.0–17.0)
MCH: 25.4 pg — ABNORMAL LOW (ref 26.0–34.0)
MCHC: 32.3 g/dL (ref 30.0–36.0)
MCV: 78.7 fL (ref 78.0–100.0)
Platelets: 303 10*3/uL (ref 150–400)
RBC: 5.74 MIL/uL (ref 4.22–5.81)
RDW: 14.6 % (ref 11.5–15.5)
WBC: 4.6 10*3/uL (ref 4.0–10.5)

## 2018-01-28 LAB — ABO/RH: ABO/RH(D): A POS

## 2018-01-28 NOTE — Progress Notes (Signed)
Abnormal EKG at pre-op appt taken to anesthesia for review. RN reports patient had Stress done in past for screening for military employment but denies any hx of chest pain, etc. Dr Renold Don reviewed and gives okay for patient to proceed with surgery as scheduled.   RN also requested last EKG, Stress Test, and LOV from office of  Dr Tollie Eth .

## 2018-01-29 NOTE — Progress Notes (Signed)
ECHO 05-19-2006 ON CHART FROM DR.SPENCER TILLEY OFFICE   LOV DR Frederico Hamman TILLEY 05-16-2006 ON CHART

## 2018-01-29 NOTE — Anesthesia Preprocedure Evaluation (Addendum)
Anesthesia Evaluation  Patient identified by MRN, date of birth, ID band Patient awake    Reviewed: Allergy & Precautions, NPO status , Patient's Chart, lab work & pertinent test results  Airway Mallampati: II  TM Distance: >3 FB Neck ROM: Full    Dental no notable dental hx.    Pulmonary neg pulmonary ROS,    Pulmonary exam normal breath sounds clear to auscultation       Cardiovascular hypertension, negative cardio ROS Normal cardiovascular exam Rhythm:Regular Rate:Normal     Neuro/Psych negative neurological ROS  negative psych ROS   GI/Hepatic negative GI ROS, Neg liver ROS,   Endo/Other  negative endocrine ROS  Renal/GU negative Renal ROS     Musculoskeletal negative musculoskeletal ROS (+)   Abdominal   Peds  Hematology negative hematology ROS (+)   Anesthesia Other Findings   Reproductive/Obstetrics                            Anesthesia Physical Anesthesia Plan  ASA: II  Anesthesia Plan: General   Post-op Pain Management:    Induction: Intravenous  PONV Risk Score and Plan: 3 and Ondansetron, Dexamethasone and Midazolam  Airway Management Planned: Oral ETT  Additional Equipment:   Intra-op Plan:   Post-operative Plan: Extubation in OR  Informed Consent: I have reviewed the patients History and Physical, chart, labs and discussed the procedure including the risks, benefits and alternatives for the proposed anesthesia with the patient or authorized representative who has indicated his/her understanding and acceptance.   Dental advisory given  Plan Discussed with: CRNA  Anesthesia Plan Comments:         Anesthesia Quick Evaluation

## 2018-01-30 ENCOUNTER — Ambulatory Visit (HOSPITAL_COMMUNITY): Payer: 59 | Admitting: Anesthesiology

## 2018-01-30 ENCOUNTER — Observation Stay (HOSPITAL_COMMUNITY)
Admission: RE | Admit: 2018-01-30 | Discharge: 2018-01-31 | Disposition: A | Discharge status: Home / Self Care | Payer: 59 | Source: Ambulatory Visit | Attending: Urology | Admitting: Urology

## 2018-01-30 ENCOUNTER — Other Ambulatory Visit: Payer: Self-pay

## 2018-01-30 ENCOUNTER — Encounter (HOSPITAL_COMMUNITY): Payer: Self-pay | Admitting: *Deleted

## 2018-01-30 ENCOUNTER — Encounter (HOSPITAL_COMMUNITY)
Admission: RE | Disposition: A | Discharge status: Home / Self Care | Payer: Self-pay | Source: Ambulatory Visit | Attending: Urology

## 2018-01-30 DIAGNOSIS — J302 Other seasonal allergic rhinitis: Secondary | ICD-10-CM | POA: Diagnosis not present

## 2018-01-30 DIAGNOSIS — Z79899 Other long term (current) drug therapy: Secondary | ICD-10-CM | POA: Diagnosis not present

## 2018-01-30 DIAGNOSIS — C61 Malignant neoplasm of prostate: Principal | ICD-10-CM | POA: Diagnosis present

## 2018-01-30 DIAGNOSIS — I10 Essential (primary) hypertension: Secondary | ICD-10-CM | POA: Insufficient documentation

## 2018-01-30 HISTORY — PX: ROBOT ASSISTED LAPAROSCOPIC RADICAL PROSTATECTOMY: SHX5141

## 2018-01-30 LAB — HEMOGLOBIN AND HEMATOCRIT, BLOOD
HEMATOCRIT: 42.6 % (ref 39.0–52.0)
Hemoglobin: 13.7 g/dL (ref 13.0–17.0)

## 2018-01-30 LAB — TYPE AND SCREEN
ABO/RH(D): A POS
Antibody Screen: NEGATIVE

## 2018-01-30 SURGERY — PROSTATECTOMY, RADICAL, ROBOT-ASSISTED, LAPAROSCOPIC
Anesthesia: General

## 2018-01-30 MED ORDER — SUGAMMADEX SODIUM 500 MG/5ML IV SOLN
INTRAVENOUS | Status: AC
  Filled 2018-01-30: qty 5

## 2018-01-30 MED ORDER — KETOROLAC TROMETHAMINE 30 MG/ML IJ SOLN
INTRAMUSCULAR | Status: AC
  Filled 2018-01-30: qty 1

## 2018-01-30 MED ORDER — HYDROMORPHONE HCL 1 MG/ML IJ SOLN
0.5000 mg | INTRAMUSCULAR | Status: DC | PRN
  Administered 2018-01-30: 0.5 mg via INTRAVENOUS
  Filled 2018-01-30: qty 1

## 2018-01-30 MED ORDER — SODIUM CHLORIDE 0.9 % IJ SOLN
INTRAMUSCULAR | Status: AC
  Filled 2018-01-30: qty 50

## 2018-01-30 MED ORDER — AMLODIPINE BESYLATE 5 MG PO TABS
5.0000 mg | ORAL_TABLET | Freq: Every day | ORAL | Status: DC
  Administered 2018-01-31: 5 mg via ORAL
  Filled 2018-01-30: qty 1

## 2018-01-30 MED ORDER — HYDROMORPHONE HCL 1 MG/ML IJ SOLN
INTRAMUSCULAR | Status: DC
Start: 2018-01-30 — End: 2018-01-30
  Filled 2018-01-30: qty 1

## 2018-01-30 MED ORDER — KETAMINE HCL 10 MG/ML IJ SOLN
INTRAMUSCULAR | Status: AC
Start: 2018-01-30 — End: ?
  Filled 2018-01-30: qty 1

## 2018-01-30 MED ORDER — DIPHENHYDRAMINE HCL 12.5 MG/5ML PO ELIX: 12.5000 mg | ORAL_SOLUTION | Freq: Four times a day (QID) | ORAL | Status: DC | PRN

## 2018-01-30 MED ORDER — KETOROLAC TROMETHAMINE 15 MG/ML IJ SOLN
15.0000 mg | Freq: Four times a day (QID) | INTRAMUSCULAR | Status: DC
  Administered 2018-01-30 – 2018-01-31 (×4): 15 mg via INTRAVENOUS
  Filled 2018-01-30 (×4): qty 1

## 2018-01-30 MED ORDER — ONDANSETRON HCL 4 MG/2ML IJ SOLN
4.0000 mg | INTRAMUSCULAR | Status: DC | PRN
  Administered 2018-01-30: 4 mg via INTRAVENOUS
  Filled 2018-01-30: qty 2

## 2018-01-30 MED ORDER — HYDROMORPHONE HCL 1 MG/ML IJ SOLN: 0.2500 mg | INTRAMUSCULAR | Status: DC | PRN

## 2018-01-30 MED ORDER — ACETAMINOPHEN 325 MG PO TABS: 650.0000 mg | ORAL_TABLET | ORAL | Status: DC | PRN

## 2018-01-30 MED ORDER — FENTANYL CITRATE (PF) 100 MCG/2ML IJ SOLN
INTRAMUSCULAR | Status: DC | PRN
  Administered 2018-01-30: 100 ug via INTRAVENOUS
  Administered 2018-01-30: 50 ug via INTRAVENOUS

## 2018-01-30 MED ORDER — DEXAMETHASONE SODIUM PHOSPHATE 10 MG/ML IJ SOLN
INTRAMUSCULAR | Status: DC | PRN
  Administered 2018-01-30: 10 mg via INTRAVENOUS

## 2018-01-30 MED ORDER — EPHEDRINE SULFATE 50 MG/ML IJ SOLN
INTRAMUSCULAR | Status: DC | PRN
  Administered 2018-01-30: 5 mg via INTRAVENOUS

## 2018-01-30 MED ORDER — BUPIVACAINE-EPINEPHRINE (PF) 0.5% -1:200000 IJ SOLN
INTRAMUSCULAR | Status: AC
  Filled 2018-01-30: qty 30

## 2018-01-30 MED ORDER — SODIUM CHLORIDE 0.9 % IV BOLUS (SEPSIS)
1000.0000 mL | Freq: Once | INTRAVENOUS | Status: AC
  Administered 2018-01-30: 1000 mL via INTRAVENOUS

## 2018-01-30 MED ORDER — LACTATED RINGERS IV SOLN
INTRAVENOUS | Status: DC | PRN
  Administered 2018-01-30 (×3): via INTRAVENOUS

## 2018-01-30 MED ORDER — BUPIVACAINE-EPINEPHRINE 0.5% -1:200000 IJ SOLN
INTRAMUSCULAR | Status: DC | PRN
  Administered 2018-01-30: 10 mL

## 2018-01-30 MED ORDER — EPHEDRINE 5 MG/ML INJ
INTRAVENOUS | Status: AC
  Filled 2018-01-30: qty 10

## 2018-01-30 MED ORDER — HYDROMORPHONE HCL 1 MG/ML IJ SOLN
INTRAMUSCULAR | Status: DC | PRN
  Administered 2018-01-30 (×2): 0.5 mg via INTRAVENOUS
  Administered 2018-01-30: 1 mg via INTRAVENOUS

## 2018-01-30 MED ORDER — SULFAMETHOXAZOLE-TRIMETHOPRIM 800-160 MG PO TABS: 1.0000 | ORAL_TABLET | Freq: Two times a day (BID) | ORAL | 0 refills | Status: DC

## 2018-01-30 MED ORDER — ONDANSETRON HCL 4 MG/2ML IJ SOLN
INTRAMUSCULAR | Status: AC
  Filled 2018-01-30: qty 2

## 2018-01-30 MED ORDER — LOSARTAN POTASSIUM 50 MG PO TABS
100.0000 mg | ORAL_TABLET | Freq: Every day | ORAL | Status: DC
  Administered 2018-01-31: 100 mg via ORAL
  Filled 2018-01-30: qty 2

## 2018-01-30 MED ORDER — ONDANSETRON HCL 4 MG/2ML IJ SOLN
INTRAMUSCULAR | Status: DC | PRN
  Administered 2018-01-30: 4 mg via INTRAVENOUS

## 2018-01-30 MED ORDER — SUGAMMADEX SODIUM 500 MG/5ML IV SOLN
INTRAVENOUS | Status: DC | PRN
  Administered 2018-01-30: 250 mg via INTRAVENOUS

## 2018-01-30 MED ORDER — FENTANYL CITRATE (PF) 250 MCG/5ML IJ SOLN
INTRAMUSCULAR | Status: AC
  Filled 2018-01-30: qty 5

## 2018-01-30 MED ORDER — ACETAMINOPHEN 10 MG/ML IV SOLN
1000.0000 mg | Freq: Four times a day (QID) | INTRAVENOUS | Status: AC
  Administered 2018-01-30 – 2018-01-31 (×4): 1000 mg via INTRAVENOUS
  Filled 2018-01-30 (×5): qty 100

## 2018-01-30 MED ORDER — TRAMADOL HCL 50 MG PO TABS: 50.0000 mg | ORAL_TABLET | Freq: Four times a day (QID) | ORAL | Status: DC | PRN

## 2018-01-30 MED ORDER — MEPERIDINE HCL 50 MG/ML IJ SOLN: 6.2500 mg | INTRAMUSCULAR | Status: DC | PRN

## 2018-01-30 MED ORDER — TRAMADOL HCL 50 MG PO TABS: 50.0000 mg | ORAL_TABLET | Freq: Four times a day (QID) | ORAL | 0 refills | Status: DC | PRN

## 2018-01-30 MED ORDER — PROPOFOL 10 MG/ML IV BOLUS
INTRAVENOUS | Status: AC
  Filled 2018-01-30: qty 20

## 2018-01-30 MED ORDER — PROMETHAZINE HCL 25 MG/ML IJ SOLN: 6.2500 mg | INTRAMUSCULAR | Status: DC | PRN

## 2018-01-30 MED ORDER — MIDAZOLAM HCL 5 MG/5ML IJ SOLN
INTRAMUSCULAR | Status: DC | PRN
  Administered 2018-01-30: 2 mg via INTRAVENOUS

## 2018-01-30 MED ORDER — ROCURONIUM BROMIDE 10 MG/ML (PF) SYRINGE
PREFILLED_SYRINGE | INTRAVENOUS | Status: AC
  Filled 2018-01-30: qty 5

## 2018-01-30 MED ORDER — ROCURONIUM BROMIDE 100 MG/10ML IV SOLN
INTRAVENOUS | Status: DC | PRN
  Administered 2018-01-30 (×2): 10 mg via INTRAVENOUS
  Administered 2018-01-30: 60 mg via INTRAVENOUS
  Administered 2018-01-30: 20 mg via INTRAVENOUS

## 2018-01-30 MED ORDER — KETOROLAC TROMETHAMINE 30 MG/ML IJ SOLN
INTRAMUSCULAR | Status: DC | PRN
  Administered 2018-01-30: 30 mg via INTRAVENOUS

## 2018-01-30 MED ORDER — MIDAZOLAM HCL 2 MG/2ML IJ SOLN
INTRAMUSCULAR | Status: AC
  Filled 2018-01-30: qty 2

## 2018-01-30 MED ORDER — HYDROMORPHONE HCL 2 MG/ML IJ SOLN
INTRAMUSCULAR | Status: AC
  Filled 2018-01-30: qty 1

## 2018-01-30 MED ORDER — ALLOPURINOL 300 MG PO TABS
300.0000 mg | ORAL_TABLET | Freq: Every day | ORAL | Status: DC
  Administered 2018-01-31: 300 mg via ORAL
  Filled 2018-01-30: qty 1

## 2018-01-30 MED ORDER — DIPHENHYDRAMINE HCL 50 MG/ML IJ SOLN
12.5000 mg | Freq: Four times a day (QID) | INTRAMUSCULAR | Status: DC | PRN
Start: 2018-01-30 — End: 2018-01-31

## 2018-01-30 MED ORDER — CEFAZOLIN SODIUM-DEXTROSE 2-4 GM/100ML-% IV SOLN
2.0000 g | INTRAVENOUS | Status: AC
  Administered 2018-01-30: 2 g via INTRAVENOUS
  Filled 2018-01-30: qty 100

## 2018-01-30 MED ORDER — PROPOFOL 10 MG/ML IV BOLUS
INTRAVENOUS | Status: DC | PRN
  Administered 2018-01-30: 200 mg via INTRAVENOUS

## 2018-01-30 MED ORDER — ORAL CARE MOUTH RINSE
15.0000 mL | Freq: Two times a day (BID) | OROMUCOSAL | Status: DC
  Administered 2018-01-30: 15 mL via OROMUCOSAL

## 2018-01-30 MED ORDER — DEXTROSE-NACL 5-0.45 % IV SOLN
INTRAVENOUS | Status: DC
  Administered 2018-01-30 (×2): via INTRAVENOUS

## 2018-01-30 MED ORDER — SODIUM CHLORIDE 0.9 % IJ SOLN
INTRAMUSCULAR | Status: DC | PRN
  Administered 2018-01-30: 50 mL

## 2018-01-30 MED ORDER — DEXAMETHASONE SODIUM PHOSPHATE 10 MG/ML IJ SOLN
INTRAMUSCULAR | Status: AC
  Filled 2018-01-30: qty 1

## 2018-01-30 MED ORDER — LIDOCAINE HCL (CARDIAC) 20 MG/ML IV SOLN
INTRAVENOUS | Status: DC | PRN
  Administered 2018-01-30: 100 mg via INTRAVENOUS

## 2018-01-30 MED ORDER — BUPIVACAINE LIPOSOME 1.3 % IJ SUSP
20.0000 mL | Freq: Once | INTRAMUSCULAR | Status: AC
Start: 2018-01-30 — End: 2018-01-30
  Administered 2018-01-30: 20 mL
  Filled 2018-01-30: qty 20

## 2018-01-30 MED ORDER — LIDOCAINE 2% (20 MG/ML) 5 ML SYRINGE
INTRAMUSCULAR | Status: AC
  Filled 2018-01-30: qty 5

## 2018-01-30 SURGICAL SUPPLY — 57 items
ADH SKN CLS APL DERMABOND .7 (GAUZE/BANDAGES/DRESSINGS) ×1
APPLICATOR COTTON TIP 6IN STRL (MISCELLANEOUS) ×6 IMPLANT
CATH FOLEY 2WAY SLVR 18FR 30CC (CATHETERS) ×3 IMPLANT
CATH TIEMANN FOLEY 18FR 5CC (CATHETERS) ×3 IMPLANT
CHLORAPREP W/TINT 26ML (MISCELLANEOUS) ×3 IMPLANT
CLIP VESOLOCK LG 6/CT PURPLE (CLIP) ×6 IMPLANT
COVER SURGICAL LIGHT HANDLE (MISCELLANEOUS) ×3 IMPLANT
COVER TIP SHEARS 8 DVNC (MISCELLANEOUS) ×1 IMPLANT
COVER TIP SHEARS 8MM DA VINCI (MISCELLANEOUS) ×2
CUTTER ECHEON FLEX ENDO 45 340 (ENDOMECHANICALS) ×3 IMPLANT
DECANTER SPIKE VIAL GLASS SM (MISCELLANEOUS) ×3 IMPLANT
DERMABOND ADVANCED (GAUZE/BANDAGES/DRESSINGS) ×2
DERMABOND ADVANCED .7 DNX12 (GAUZE/BANDAGES/DRESSINGS) ×1 IMPLANT
DRAPE ARM DVNC X/XI (DISPOSABLE) ×4 IMPLANT
DRAPE COLUMN DVNC XI (DISPOSABLE) ×1 IMPLANT
DRAPE DA VINCI XI ARM (DISPOSABLE) ×8
DRAPE DA VINCI XI COLUMN (DISPOSABLE) ×2
DRAPE SURG IRRIG POUCH 19X23 (DRAPES) ×3 IMPLANT
DRSG TEGADERM 4X4.75 (GAUZE/BANDAGES/DRESSINGS) ×3 IMPLANT
ELECT REM PT RETURN 15FT ADLT (MISCELLANEOUS) ×3 IMPLANT
GAUZE SPONGE 2X2 8PLY STRL LF (GAUZE/BANDAGES/DRESSINGS) IMPLANT
GLOVE BIO SURGEON STRL SZ 6.5 (GLOVE) ×2 IMPLANT
GLOVE BIO SURGEONS STRL SZ 6.5 (GLOVE) ×1
GLOVE BIOGEL M STRL SZ7.5 (GLOVE) ×6 IMPLANT
GOWN STRL REUS W/TWL LRG LVL3 (GOWN DISPOSABLE) ×3 IMPLANT
GOWN STRL REUS W/TWL XL LVL3 (GOWN DISPOSABLE) ×6 IMPLANT
HOLDER FOLEY CATH W/STRAP (MISCELLANEOUS) ×3 IMPLANT
IRRIG SUCT STRYKERFLOW 2 WTIP (MISCELLANEOUS) ×3
IRRIGATION SUCT STRKRFLW 2 WTP (MISCELLANEOUS) ×1 IMPLANT
IV LACTATED RINGERS 1000ML (IV SOLUTION) ×3 IMPLANT
NDL INSUFFLATION 14GA 120MM (NEEDLE) IMPLANT
NEEDLE INSUFFLATION 14GA 120MM (NEEDLE) ×3 IMPLANT
PACK ROBOT UROLOGY CUSTOM (CUSTOM PROCEDURE TRAY) ×3 IMPLANT
PAD POSITIONING PINK XL (MISCELLANEOUS) ×3 IMPLANT
PORT ACCESS TROCAR AIRSEAL 12 (TROCAR) ×1 IMPLANT
PORT ACCESS TROCAR AIRSEAL 5M (TROCAR) ×2
RELOAD STAPLE 45 4.1 GRN THCK (STAPLE) ×1 IMPLANT
SEAL CANN UNIV 5-8 DVNC XI (MISCELLANEOUS) ×4 IMPLANT
SEAL XI 5MM-8MM UNIVERSAL (MISCELLANEOUS) ×8
SET TRI-LUMEN FLTR TB AIRSEAL (TUBING) ×2 IMPLANT
SOLUTION ELECTROLUBE (MISCELLANEOUS) ×3 IMPLANT
SPONGE GAUZE 2X2 STER 10/PKG (GAUZE/BANDAGES/DRESSINGS)
STAPLE RELOAD 45 GRN (STAPLE) ×1 IMPLANT
STAPLE RELOAD 45MM GREEN (STAPLE) ×3
SUT ETHILON 3 0 PS 1 (SUTURE) ×3 IMPLANT
SUT MNCRL AB 4-0 PS2 18 (SUTURE) ×6 IMPLANT
SUT V-LOC BARB 180 2/0GR6 GS22 (SUTURE) ×3
SUT VIC AB 0 CT1 27 (SUTURE) ×3
SUT VIC AB 0 CT1 27XBRD ANTBC (SUTURE) ×1 IMPLANT
SUT VIC AB 2-0 SH 27 (SUTURE) ×3
SUT VIC AB 2-0 SH 27X BRD (SUTURE) ×1 IMPLANT
SUT VICRYL 0 UR6 27IN ABS (SUTURE) ×6 IMPLANT
SUT VLOC BARB 180 ABS3/0GR12 (SUTURE) ×6
SUTURE V-LC BRB 180 2/0GR6GS22 (SUTURE) ×1 IMPLANT
SUTURE VLOC BRB 180 ABS3/0GR12 (SUTURE) ×2 IMPLANT
TOWEL OR NON WOVEN STRL DISP B (DISPOSABLE) ×3 IMPLANT
WATER STERILE IRR 1000ML POUR (IV SOLUTION) ×3 IMPLANT

## 2018-01-30 NOTE — Anesthesia Procedure Notes (Signed)
Procedure Name: Intubation Date/Time: 01/30/2018 7:44 AM Performed by: Glory Buff, CRNA Pre-anesthesia Checklist: Patient identified, Emergency Drugs available, Suction available and Patient being monitored Patient Re-evaluated:Patient Re-evaluated prior to induction Oxygen Delivery Method: Circle system utilized Preoxygenation: Pre-oxygenation with 100% oxygen Induction Type: IV induction Ventilation: Mask ventilation without difficulty Laryngoscope Size: Miller and 3 Grade View: Grade II Tube type: Oral Tube size: 7.5 mm Number of attempts: 1 Airway Equipment and Method: Oral airway and Bougie stylet Placement Confirmation: ETT inserted through vocal cords under direct vision,  positive ETCO2 and breath sounds checked- equal and bilateral Secured at: 22 cm Tube secured with: Tape Dental Injury: Teeth and Oropharynx as per pre-operative assessment  Comments: DL x 1 with arytenoids seen, bougie stylet passed with ease and 7.5 ETT threaded over with + ETCO2 and BBS=.

## 2018-01-30 NOTE — Transfer of Care (Signed)
Immediate Anesthesia Transfer of Care Note  Patient: George Yang  Procedure(s) Performed: XI ROBOTIC ASSISTED LAPAROSCOPIC RADICAL PROSTATECTOMY (N/A )  Patient Location: PACU  Anesthesia Type:General  Level of Consciousness: awake, alert  and oriented  Airway & Oxygen Therapy: Patient Spontanous Breathing and Patient connected to face mask oxygen  Post-op Assessment: Report given to RN and Post -op Vital signs reviewed and stable  Post vital signs: Reviewed and stable  Last Vitals:  Vitals:   01/30/18 0554  BP: 135/86  Pulse: 69  Resp: 18  Temp: 36.9 C  SpO2: 100%    Last Pain:  Vitals:   01/30/18 0554  TempSrc: Oral      Patients Stated Pain Goal: 4 (02/33/43 5686)  Complications: No apparent anesthesia complications

## 2018-01-30 NOTE — Discharge Instructions (Signed)

## 2018-01-30 NOTE — H&P (Signed)
f/u to monitor Prostate Cancer  HPI: George Yang is a 47 year-old male established patient who is here for interval evaluation of his prostate cancer.  He was diagnosed with prostate cancer in approximately 12/01/2017. The patient's gleason score was: 3+3=6 (2/12 cores). Pretreatment PSA: 5.8.   He does not have problems with erections. His erections are satisfactory for intercouse.   The patient denies any progressive voiding symptoms.   Prostate cancer profile  Stage: T1c  PSA: 5.8  Biopsy: ,2 /12 cores positive: 3+3=6  Prostate volume: 40 gm  Prostate cancer nomogram after radical prostatectomy:  OC- 69  ECE-  SVI- 1  LNI -1  PFS (surgery)- 89% at 10 yrs.   Intv: The patient presents today for discussion of his newly diagnosed prostate cancer. He has recovered well from his prostate biopsy. He denies any fevers or chills. He denies any ongoing hematuria.   The patient has a past surgical history significant for an appendectomy 40 years ago. He has a cyst lower right quadrant scar.      ALLERGIES: No Allergies    MEDICATIONS: Amlodipine Besylate 5 mg tablet  Flonase Allergy Relief 50 mcg/actuation spray, suspension  Losartan Potassium 100 mg tablet     GU PSH: Prostate Needle Biopsy - 11/27/2017    NON-GU PSH: Appendectomy (laparoscopic), 1988 Surgical Pathology, Gross And Microscopic Examination For Prostate Needle - 11/27/2017    GU PMH: BPH w/o LUTS - 11/10/2017 Elevated PSA - 08/04/2017    NON-GU PMH: Hypertension    FAMILY HISTORY: 1 Daughter - Daughter Breast Cancer - Runs in Family Diabetes - Runs in Family Hypertension - Runs in Family   SOCIAL HISTORY: Marital Status: Married Preferred Language: English; Ethnicity: Not Hispanic Or Latino; Race: Black or African American Current Smoking Status: Patient has never smoked.   Tobacco Use Assessment Completed: Used Tobacco in last 30 days? Does drink.  Drinks 1 caffeinated drink per day. Patient's  occupation Airline pilot.    REVIEW OF SYSTEMS:    GU Review Male:   Patient denies frequent urination, hard to postpone urination, burning/ pain with urination, get up at night to urinate, leakage of urine, stream starts and stops, trouble starting your stream, have to strain to urinate , erection problems, and penile pain.  Gastrointestinal (Upper):   Patient denies nausea, vomiting, and indigestion/ heartburn.  Gastrointestinal (Lower):   Patient denies diarrhea and constipation.  Constitutional:   Patient denies fever, night sweats, weight loss, and fatigue.  Skin:   Patient denies skin rash/ lesion and itching.  Eyes:   Patient denies blurred vision and double vision.  Ears/ Nose/ Throat:   Patient denies sore throat and sinus problems.  Hematologic/Lymphatic:   Patient denies swollen glands and easy bruising.  Cardiovascular:   Patient denies leg swelling and chest pains.  Respiratory:   Patient denies cough and shortness of breath.  Endocrine:   Patient denies excessive thirst.  Musculoskeletal:   Patient denies back pain and joint pain.  Neurological:   Patient denies headaches and dizziness.  Psychologic:   Patient denies depression and anxiety.   VITAL SIGNS: None   MULTI-SYSTEM PHYSICAL EXAMINATION:    Constitutional: Well-nourished. No physical deformities. Normally developed. Good grooming.   Respiratory: No labored breathing, no use of accessory muscles. Clear to auscultation bilaterally  Cardiovascular: Normal temperature, normal extremity pulses, no swelling, no varicosities. Regular rate and rhythm  Gastrointestinal: No mass, no tenderness, no rigidity, non obese abdomen. Small well-healed scar in the right  lower quadrant     PAST DATA REVIEWED:  Source Of History:  Patient  Records Review:   Pathology Reports, Previous Doctor Records, Previous Patient Records, POC Tool   11/03/17 08/04/17  PSA  Total PSA 5.82 ng/mL 5.83 ng/mL  Free PSA 1.70 ng/mL 1.19 ng/mL   % Free PSA 29 % PSA 20 % PSA    PROCEDURES:          Urinalysis w/Scope - 81001 Dipstick Dipstick Cont'd Micro  Color: Yellow Bilirubin: Neg WBC/hpf: NS (Not Seen)  Appearance: Clear Ketones: Neg RBC/hpf: 0 - 2/hpf  Specific Gravity: 1.015 Blood: Trace Bacteria: NS (Not Seen)  pH: <=5.0 Protein: Neg Cystals: NS (Not Seen)  Glucose: Neg Urobilinogen: 0.2 Casts: NS (Not Seen)    Nitrites: Neg Trichomonas: Not Present    Leukocyte Esterase: Neg Mucous: Not Present      Epithelial Cells: 0 - 5/hpf      Yeast: NS (Not Seen)      Sperm: Not Present    Notes:      ASSESSMENT:      ICD-10 Details  1 GU:   Prostate Cancer - C61    PLAN:           Schedule Return Visit/Planned Activity: 1 Week - PT/OT Referral  Return Visit/Planned Activity: 3 Weeks - Schedule Surgery          Document Letter(s):  Created for Patient: Clinical Summary    We discussed prostatectomy and specifically robotic prostatectomy with bilateral pelvic lymphadenectomy. I showed the patient on their abdomen the approximately 6 small incision (trocar) sites as well as presumed extraction sites with robotic approach as well as possible open incision sites should open conversion be necessary. We discussed peri-operative risks including bleeding, infection, deep vein thrombosis, pulmonary embolism, compartment syndrome, neuropathy / neuropraxia, heart attack, stroke, death, as well as long-term risks such as non-cure / need for additional therapy. We specifically addressed that the procedure would compromise urinary control leading to stress incontinence which typically resolves with time and pelvic rehabilitation (Kegel's, etc..), but can sometimes be permanent and require additional therapy including surgery. We also specifically addressed sexual side-effects including significant erectile dysfunction which typically partially resolves with time but can also be permanent and require additional therapy including  surgery.   We discussed the typical hospital course including usual 1-2 night hospitalization, discharge with foley catheter in place usually for 1-2 weeks before voiding trial as well as usually 2 week recovery until able to perform most non-strenuous activity and 6 weeks until able to return to most jobs and more strenuous activity such as exercise.         Notes:   We will plan to perform a bilateral nerve sparing prostatectomy. We don't need to do a lymph node dissection given his low risk. I would like the patient to follow-up with physical therapy prior to his surgery date. I also gave the patient information for the prostate cancer support group. An further, I recommended that he participate in the class of the [facility] long hospital through the nursing staff.

## 2018-01-30 NOTE — Op Note (Signed)
Preoperative diagnosis:  1. Prostate Cancer   Postoperative diagnosis:  1. same   Procedure: 1. Robotic assisted laparoscopic radical prostatectomy  Surgeon: Ardis Hughs, MD First Assistant: Debbrah Alar, PA Resident Surgeon: Jonna Clark, MD  Anesthesia: General  Complications: None  Intraoperative findings: Bilateral nerve spare was performed, tight anastomosis noted  EBL: 100 cc  Specimens:  #1.  Prostate and seminal vesicals   Indication: George Yang is a 47 y.o. patient with prostate cancer.  After reviewing the management options for treatment, he elected to proceed with the removal of his prostate. We have discussed the potential benefits and risks of the procedure, side effects of the proposed treatment, the likelihood of the patient achieving the goals of the procedure, and any potential problems that might occur during the procedure or recuperation. Informed consent has been obtained.  Description of procedure:  An assistant was required for this surgical procedure.  The duties of the assistant included but were not limited to suctioning, passing suture, camera manipulation, retraction. This procedure would not be able to be performed without an Environmental consultant.  The patient was consented in the preoperative holding area. He was in brought back to the operating room placed the table in supine position. General anesthesia was then induced and endotracheal tube was inserted. He was then placed in dorsolithotomy position and placed in steep Trendelenburg. He was then prepped and draped in the routine sterile fashion.  Gently then passed the Veress needle into the peritoneum using the standard technique.  We then insufflated the abdomen to 15 millimeters mercury.  We then made a 10 mm incision midline above the umbilicus and passed a robotic trocar through.  Then were able to inspect the area immediately below this which was noted to be atraumatic.   We then placed 2  additional a 8 millimeter trochars in the patient's left lower abdomen proximally 9 cm apart and 2 trochars on the patient's right lower abdomen, one was an 8 mm trocar and the one most lateral was a 12 mm trocar which was used as the assistant port. A 5 mm trocar was placed by triangulating the 2 right lateral ports as a second assistant port. These ports were all placed under visual guidance. Once the ports were noted to be satisfactory position the robot was docked. We started with the 0 lens, monopolar scissors in the right hand and the [place] forceps the left hand as well as a fenestrated grasper as the third arm on the left-hand side.   We, the first assistant and I,  began our dissection the posterior plane incising the peritoneum at the level of the vas deferens. Isolated the left vas deferens and dissected it proximally towards the spermatic cord for 5 cm prior to ligating it. Then used this as traction to isolate the left the seminal vesicle which was then undressed bluntly and completely dissected out, all vessels were cauterized with a combination of bipolar and the monopolar scissors. We then turned our attention to the right side and similarly dissected out the right vas deferens and seminal vesicle. Once the SVs had been freed, we turned our attention to the posterior plane and bluntly dissected the tissue between the rectum and the posterior wall of the prostate bluntly out towards the apex.    At this point the bladder was taken down starting at the urachal remnant with a combination of both blunt dissection and sharp dissection using monopolar cautery the bladder was dropped down in the usual  fashion to the medial umbilical ligaments laterally and the dorsal vein of the prostate anteriorly creating our space of Retzius. We then turned our attention to the endopelvic fascia which was incised laterally starting on the patient's right-hand side the levator muscles were pushed off the prostate  laterally up towards the dorsal vein complex on the right-hand side. This process was then repeated on the left-hand side and a nice notch was created for the dorsal vein. I then used a 5mm stapler to staple the dorsal vein.   We,the first assistant and I, then located the bladder neck at the vesicoprostatic junction and using the monopolar scissors dissected down through the perivesical tissues and the bladder neck down to the prostatic urethra. The catheter was then deflated and pulled through our urethral opening and then used to retract the prostate anteriorly for the posterior bladder neck dissection. Once through the bladder neck and into the posterior plane of the prostate, the SVs were brought through the opening. The left pedicle was then isolated and systematically ligated with Weck clips and scissors. The nerve bundle was then peeled off the posterior lateral aspect of the prostate and bluntly dissected away off the prostate. This was then repeated on the right side.    I then came down through the dorsal venous complex anteriorly down to the membranous urethra using the monopolar. Once down to the urethra, it was transected sharply and the apex of the prostate was then dissected off the levator and rectourethralis muscles. Once the apex of the prostate had been dissected free we came back to the base of the prostate and bluntly push the rectum and nerve vascular bundle off the prostate the patient's left and used clips on the patient's right to free the prostate. Once the prostate was free it was placed off to the side. The pelvis was then irrigated with normal saline and noted to be relatively hemostatic.  The prostate was placed in the Endo Catch bag and the string brought to the 5 mm port.    The vesicourethral anastomosis was then completed with 2 interlocking 3-0 V. lock sutures running the anastomosis in the 6:00 position to the 12:00 position on each side and then tying it off on the top.  The final catheter was then passed through the patient's urethra and into the bladder and 120 cc was instilled into the bladder to test the anastomosis. As there was no leak a 46 [place] Blake drain was passed through the left lateral port and placed around the vesicourethral anastomosis. A 12 mm assistant port on the right lateral side was then closed with 0 Vicryl with the help of the [employer] needle. The 12 mm midline infraumbilical incision was then extended another centimeter taken down and the fascia opened to remove the Endo Catch bag with the prostate specimen. The fascia was then closed with a 0 Vicryl and all skin ports were closed with 4-0 Monocryl in a subcutaneous fashion. Dermabond glue was then applied to the incisions. The drain was then secured to the skin with a 0 nylon stitch and dressing applied.   At the end of the case all laps needles and sponges had been accounted for. There no immediate complications. The patient returned to the PACU in stable condition.

## 2018-01-30 NOTE — Anesthesia Postprocedure Evaluation (Signed)
Anesthesia Post Note  Patient: George Yang  Procedure(s) Performed: XI ROBOTIC ASSISTED LAPAROSCOPIC RADICAL PROSTATECTOMY (N/A )     Patient location during evaluation: PACU Anesthesia Type: General Level of consciousness: sedated and patient cooperative Pain management: pain level controlled Vital Signs Assessment: post-procedure vital signs reviewed and stable Respiratory status: spontaneous breathing Cardiovascular status: stable Anesthetic complications: no    Last Vitals:  Vitals:   01/30/18 1230 01/30/18 1250  BP: (!) 142/80 (!) 147/84  Pulse: 90 90  Resp: 18 15  Temp: 36.7 C (!) 36.3 C  SpO2: 99% 98%    Last Pain:  Vitals:   01/30/18 1450  TempSrc:   PainSc: [place]

## 2018-01-31 ENCOUNTER — Encounter (HOSPITAL_COMMUNITY): Payer: Self-pay | Admitting: Urology

## 2018-01-31 DIAGNOSIS — C61 Malignant neoplasm of prostate: Secondary | ICD-10-CM | POA: Diagnosis not present

## 2018-01-31 LAB — HEMOGLOBIN AND HEMATOCRIT, BLOOD
HCT: 39 % (ref 39.0–52.0)
Hemoglobin: 12.8 g/dL — ABNORMAL LOW (ref 13.0–17.0)

## 2018-01-31 LAB — BASIC METABOLIC PANEL
Anion gap: 9 (ref 5–15)
BUN: 10 mg/dL (ref 6–20)
CO2: 24 mmol/L (ref 22–32)
Calcium: 8.5 mg/dL — ABNORMAL LOW (ref 8.9–10.3)
Chloride: 102 mmol/L (ref 101–111)
Creatinine, Ser: 1.27 mg/dL — ABNORMAL HIGH (ref 0.61–1.24)
GFR calc Af Amer: >60 mL/min (ref >60)
GLUCOSE: 103 mg/dL — AB (ref 65–99)
Potassium: 3.6 mmol/L (ref 3.5–5.1)
Sodium: 135 mmol/L (ref 135–145)

## 2018-01-31 MED ORDER — SIMETHICONE 80 MG PO CHEW: 80.0000 mg | CHEWABLE_TABLET | Freq: Four times a day (QID) | ORAL | 2 refills | Status: DC | PRN

## 2018-01-31 MED ORDER — BISACODYL 10 MG RE SUPP
10.0000 mg | Freq: Once | RECTAL | Status: DC
  Filled 2018-01-31: qty 1

## 2018-01-31 NOTE — Discharge Summary (Signed)
Physician Discharge Summary  Patient ID: George Yang MRN: [id number] DOB/AGE: Sep 16, 1971 47 y.o.  Admit date: 01/30/2018 Discharge date: 01/31/2018  Admission Diagnoses:  Prostate cancer [facility])  Discharge Diagnoses:  Principal Problem:   Prostate cancer [facility])   Past Medical History:  Diagnosis Date  . ALLERGIC RHINITIS   . Cancer [facility])    prostate cancer   . Gout attack    left great toe pain onset in december; has allopurinol and colchicine prescribed   . Hypertension   . Swimmer's ear    now completed rx ear drops     Surgeries: Procedure(s): XI ROBOTIC ASSISTED LAPAROSCOPIC RADICAL PROSTATECTOMY on 01/30/2018   Consultants (if any):   Discharged Condition: Improved  Hospital Course: George Yang is an 47 y.o. male who was admitted 01/30/2018 with a diagnosis of Prostate cancer ([place]) and went to the operating room on 01/30/2018 and underwent the above named procedures.  He has done well postop but has gas pain this AM.  JP output minimal and drain will be removed.  He will be discharged home today.   He was given perioperative antibiotics:  Anti-infectives (From admission, onward)   Start     Dose/Rate Route Frequency Ordered Stop   01/30/18 0606  ceFAZolin (ANCEF) IVPB 2g/100 mL premix     2 g 200 mL/hr over 30 Minutes Intravenous 30 min pre-op 01/30/18 0606 01/30/18 0815   01/30/18 0000  sulfamethoxazole-trimethoprim (BACTRIM DS,SEPTRA DS) 800-160 MG tablet     1 tablet Oral 2 times daily 01/30/18 0733      .  He was given sequential compression devices and early ambulation, for DVT prophylaxis.  He benefited maximally from the hospital stay and there were no complications.    Recent vital signs:  Vitals:   01/31/18 0031 01/31/18 0435  BP: 133/64 134/64  Pulse: 85 77  Resp: 18 16  Temp: 99.1 F (37.3 C) 98.1 F (36.7 C)  SpO2: 99% 99%   Gen: WD, WN in NAD Lungs: CTA with normal effort. CV: RRR without murmur. GI: Soft with mild distention and  tenderness.  BS+.  Wounds intact.  JP with min output.  EXT: FROM without edema. GU: foley draining well.    Recent laboratory studies:  Lab Results  Component Value Date   HGB 12.8 (L) 01/31/2018   HGB 13.7 01/30/2018   HGB 14.6 01/28/2018   Lab Results  Component Value Date   WBC 4.6 01/28/2018   PLT 303 01/28/2018   No results found for: INR Lab Results  Component Value Date   NA 139 01/28/2018   K 4.0 01/28/2018   CL 105 01/28/2018   CO2 27 01/28/2018   BUN 13 01/28/2018   CREATININE 1.07 01/28/2018   GLUCOSE 91 01/28/2018    Discharge Medications:   Allergies as of 01/31/2018   No Known Allergies     Medication List    STOP taking these medications   ibuprofen 200 MG tablet Commonly known as:  ADVIL,MOTRIN     TAKE these medications   allopurinol 300 MG tablet Commonly known as:  ZYLOPRIM Take 300 mg by mouth daily.   amLODipine 5 MG tablet Commonly known as:  NORVASC Take 5 mg by mouth daily.   cetirizine 10 MG tablet Commonly known as:  ZYRTEC TAKE 1 TABLET BY MOUTH EVERY DAY What changed:    how much to take  how to take this  when to take this  additional instructions   colchicine 0.6 MG tablet  Take 0.6 mg by mouth daily as needed (for gout flare up).   fluticasone 50 MCG/ACT nasal spray Commonly known as:  FLONASE PLACE 2 SPRAYS INTO THE NOSE EVERY DAY. What changed:  See the new instructions.   indomethacin 50 MG capsule Commonly known as:  INDOCIN Take 50 mg by mouth 3 (three) times daily as needed (for gout flare up).   losartan 100 MG tablet Commonly known as:  COZAAR Take 100 mg by mouth daily.   neomycin-polymyxin-hydrocortisone OTIC solution Commonly known as:  CORTISPORIN Place 1 drop into both ears daily.   simethicone 80 MG chewable tablet Commonly known as:  GAS-X Chew 1 tablet (80 mg total) by mouth 4 (four) times daily as needed for flatulence.   sulfamethoxazole-trimethoprim 800-160 MG tablet Commonly known  as:  BACTRIM DS,SEPTRA DS Take 1 tablet by mouth 2 (two) times daily. Start the day prior to foley removal appointment   traMADol 50 MG tablet Commonly known as:  ULTRAM Take 1-2 tablets (50-100 mg total) by mouth every 6 (six) hours as needed for moderate pain or severe pain.       Diagnostic Studies: No results found.  Disposition: Final discharge disposition not confirmed    Follow-up Information    Karen Kays, NP Follow up on 02/06/2018.   Specialty:  Nurse Practitioner Why:  at 11:00 Contact information: [address] [place] [address] [phone]            Signed: Irine Seal 01/31/2018, 7:42 AM

## 2018-02-04 DIAGNOSIS — I10 Essential (primary) hypertension: Secondary | ICD-10-CM | POA: Insufficient documentation

## 2018-02-04 DIAGNOSIS — R1032 Left lower quadrant pain: Secondary | ICD-10-CM | POA: Diagnosis present

## 2018-02-04 DIAGNOSIS — Z79899 Other long term (current) drug therapy: Secondary | ICD-10-CM | POA: Diagnosis not present

## 2018-02-04 DIAGNOSIS — Z9289 Personal history of other medical treatment: Secondary | ICD-10-CM | POA: Insufficient documentation

## 2018-02-04 DIAGNOSIS — N3001 Acute cystitis with hematuria: Secondary | ICD-10-CM | POA: Insufficient documentation

## 2018-02-04 DIAGNOSIS — R109 Unspecified abdominal pain: Secondary | ICD-10-CM | POA: Diagnosis not present

## 2018-02-04 DIAGNOSIS — T83098A Other mechanical complication of other indwelling urethral catheter, initial encounter: Secondary | ICD-10-CM | POA: Diagnosis not present

## 2018-02-05 ENCOUNTER — Encounter (HOSPITAL_COMMUNITY): Payer: Self-pay | Admitting: Emergency Medicine

## 2018-02-05 ENCOUNTER — Other Ambulatory Visit: Payer: Self-pay

## 2018-02-05 ENCOUNTER — Emergency Department (HOSPITAL_COMMUNITY)
Admission: EM | Admit: 2018-02-05 | Discharge: 2018-02-05 | Disposition: A | Discharge status: Home / Self Care | Payer: 59 | Attending: Emergency Medicine | Admitting: Emergency Medicine

## 2018-02-05 DIAGNOSIS — N3001 Acute cystitis with hematuria: Secondary | ICD-10-CM | POA: Diagnosis not present

## 2018-02-05 DIAGNOSIS — Z978 Presence of other specified devices: Secondary | ICD-10-CM

## 2018-02-05 DIAGNOSIS — Z960 Presence of urogenital implants: Secondary | ICD-10-CM

## 2018-02-05 LAB — CBC
HEMATOCRIT: 39.2 % (ref 39.0–52.0)
Hemoglobin: 13.3 g/dL (ref 13.0–17.0)
MCH: 26 pg (ref 26.0–34.0)
MCHC: 33.9 g/dL (ref 30.0–36.0)
MCV: 76.6 fL — AB (ref 78.0–100.0)
Platelets: 302 10*3/uL (ref 150–400)
RBC: 5.12 MIL/uL (ref 4.22–5.81)
RDW: 14 % (ref 11.5–15.5)
WBC: 8.9 10*3/uL (ref 4.0–10.5)

## 2018-02-05 LAB — URINALYSIS, ROUTINE W REFLEX MICROSCOPIC
BILIRUBIN URINE: NEGATIVE
Glucose, UA: NEGATIVE mg/dL
Ketones, ur: 5 mg/dL — AB
Nitrite: POSITIVE — AB
PROTEIN: 30 mg/dL — AB
SPECIFIC GRAVITY, URINE: 1.018 (ref 1.005–1.030)
pH: 7 (ref 5.0–8.0)

## 2018-02-05 LAB — COMPREHENSIVE METABOLIC PANEL
ALBUMIN: 4 g/dL (ref 3.5–5.0)
ALT: 22 U/L (ref 17–63)
AST: 20 U/L (ref 15–41)
Alkaline Phosphatase: 65 U/L (ref 38–126)
Anion gap: 8 (ref 5–15)
BUN: 17 mg/dL (ref 6–20)
CHLORIDE: 102 mmol/L (ref 101–111)
CO2: 26 mmol/L (ref 22–32)
Calcium: 9.2 mg/dL (ref 8.9–10.3)
Creatinine, Ser: 1.53 mg/dL — ABNORMAL HIGH (ref 0.61–1.24)
GFR calc Af Amer: >60 mL/min (ref >60)
GFR calc non Af Amer: 53 mL/min — ABNORMAL LOW (ref >60)
GLUCOSE: 107 mg/dL — AB (ref 65–99)
Potassium: 4.2 mmol/L (ref 3.5–5.1)
SODIUM: 136 mmol/L (ref 135–145)
Total Bilirubin: 0.6 mg/dL (ref 0.3–1.2)
Total Protein: 7.6 g/dL (ref 6.5–8.1)

## 2018-02-05 LAB — LIPASE, BLOOD: LIPASE: 24 U/L (ref 11–51)

## 2018-02-05 MED ORDER — ONDANSETRON 4 MG PO TBDP: 4.0000 mg | ORAL_TABLET | Freq: Three times a day (TID) | ORAL | 0 refills | Status: DC | PRN

## 2018-02-05 MED ORDER — OXYCODONE-ACETAMINOPHEN 5-325 MG PO TABS: 1.0000 | ORAL_TABLET | ORAL | 0 refills | Status: DC | PRN

## 2018-02-05 MED ORDER — ONDANSETRON 4 MG PO TBDP
4.0000 mg | ORAL_TABLET | Freq: Once | ORAL | Status: AC | PRN
  Administered 2018-02-05: 4 mg via ORAL
  Filled 2018-02-05: qty 1

## 2018-02-05 NOTE — ED Provider Notes (Signed)
[facility] DEPT Provider Note   CSN: [id number] Arrival date & time: 02/04/18  2315     History   Chief Complaint Chief Complaint  Patient presents with  . Abdominal Pain    HPI George Yang is a 47 y.o. male w/ h/o prostate cancer s/p laparoscopic prostectomy 6 days ago by Dr Louis Meckel here for evaluation of sharp, constant left mid abdominal pain onset 6 pm last night. He took 100 mg tramadol initially did not help the pain but pain actually now resolved. Associated symptoms include nausea and nbnb vomiting x 1 that he attributes to severity of pain. Wife at bedside is a Marine scientist, she thought it was maybe gas pain tried to give him warm water but did not help. States this pain was actually there immediately after surgery. Is supposed to start bactrim for UTI today prescribed by urology. He has f/u with urology tomorrow at 11 am.   Abdomina pain has resolved. Pt denies fevers, chills, dysuria, hematuria, diarrhea, constipation, redness, erythema to surgical incisions. Foley is uncomfortable but no bleeding or drainage around urethra per wife who has been doing foley care at home.   HPI  Past Medical History:  Diagnosis Date  . ALLERGIC RHINITIS   . Cancer [facility])    prostate cancer   . Gout attack    left great toe pain onset in december; has allopurinol and colchicine prescribed   . Hypertension   . Swimmer's ear    now completed rx ear drops     Patient Active Problem List   Diagnosis Date Noted  . Prostate cancer ([place]) 01/30/2018  . HYPERTENSION NEC 03/30/2008  . Seasonal and perennial allergic rhinitis 02/17/2008    Past Surgical History:  Procedure Laterality Date  . APPENDECTOMY    . KELOID EXCISION    . PROSTATE BIOPSY  11/2017  . ROBOT ASSISTED LAPAROSCOPIC RADICAL PROSTATECTOMY N/A 01/30/2018   Procedure: XI ROBOTIC ASSISTED LAPAROSCOPIC RADICAL PROSTATECTOMY;  Surgeon: Ardis Hughs, MD;  Location: [facility] ORS;  Service: Urology;   Laterality: N/A;       Home Medications    Prior to Admission medications   Medication Sig Start Date End Date Taking? Authorizing Provider  allopurinol (ZYLOPRIM) 300 MG tablet Take 300 mg by mouth daily.   Yes [provider]  amLODipine (NORVASC) 5 MG tablet Take 5 mg by mouth daily.  09/07/12  Yes [provider]  cetirizine (ZYRTEC) 10 MG tablet TAKE 1 TABLET BY MOUTH EVERY DAY Patient taking differently: Take 10 mg by mouth daily.  10/14/14  Yes Young, Tarri Fuller D, MD  colchicine 0.6 MG tablet Take 0.6 mg by mouth daily as needed (for gout flare up).  12/18/17  Yes [provider]  fluticasone (FLONASE) 50 MCG/ACT nasal spray PLACE 2 SPRAYS INTO THE NOSE EVERY DAY. Patient taking differently: Use 2 sprays in each nostril daily as needed for congestion 12/12/14  Yes Young, Tarri Fuller D, MD  indomethacin (INDOCIN) 50 MG capsule Take 50 mg by mouth 3 (three) times daily as needed (for gout flare up).  12/18/17  Yes [provider]  losartan (COZAAR) 100 MG tablet Take 100 mg by mouth daily.  09/09/12  Yes [provider]  simethicone (GAS-X) 80 MG chewable tablet Chew 1 tablet (80 mg total) by mouth 4 (four) times daily as needed for flatulence. 01/31/18 01/31/19 Yes Irine Seal, MD  traMADol (ULTRAM) 50 MG tablet Take 1-2 tablets (50-100 mg total) by mouth every 6 (six)  hours as needed for moderate pain or severe pain. 01/30/18  Yes Dancy, Amanda, PA-C  ondansetron (ZOFRAN ODT) 4 MG disintegrating tablet Take 1 tablet (4 mg total) by mouth every 8 (eight) hours as needed for nausea or vomiting. 02/05/18   Kinnie Feil, PA-C  oxyCODONE-acetaminophen (PERCOCET/ROXICET) 5-325 MG tablet Take 1 tablet by mouth every 4 (four) hours as needed for severe pain. 02/05/18   Kinnie Feil, PA-C  sulfamethoxazole-trimethoprim (BACTRIM DS,SEPTRA DS) 800-160 MG tablet Take 1 tablet by mouth 2 (two) times daily. Start the day prior to foley removal appointment 01/30/18    Debbrah Alar, PA-C    Family History History reviewed. No pertinent family history.  Social History Social History   Tobacco Use  . Smoking status: Never Smoker  . Smokeless tobacco: Never Used  Substance Use Topics  . Alcohol use: Yes    Comment: occ  . Drug use: No     Allergies   Patient has no known allergies.   Review of Systems Review of Systems  Gastrointestinal: Positive for abdominal pain (resolved), nausea (resolved) and vomiting (x1).  Genitourinary: Positive for difficulty urinating (foley in place).  All other systems reviewed and are negative.    Physical Exam Updated Vital Signs BP 138/90 (BP Location: Right Arm)   Pulse 68   Temp 98.3 F (36.8 C) (Oral)   Resp 18   Ht 6' (1.829 m)   Wt 104.3 kg (230 lb)   SpO2 100%   BMI 31.19 kg/m   Physical Exam  Constitutional: He is oriented to person, place, and time. He appears well-developed and well-nourished. No distress.  Non toxic.  HENT:  Head: Normocephalic and atraumatic.  Nose: Nose normal.  Mouth/Throat: No oropharyngeal exudate.  Moist mucous membranes   Eyes: Conjunctivae and EOM are normal. Pupils are equal, round, and reactive to light.  Neck: Normal range of motion.  Cardiovascular: Normal rate, regular rhythm, normal heart sounds and intact distal pulses.  No murmur heard. 2+ DP and radial pulses bilaterally. No LE edema.   Pulmonary/Chest: Effort normal and breath sounds normal.  Abdominal: Soft. Bowel sounds are normal. There is no tenderness.  Abdomen is soft, NTND. Pt points to left lateral mid abdomen area where he had pain, this is at left most lateral surgical incision area but no longer having pain there. This area has no TTP. No G/R/R. No suprapubic or CVA tenderness.   Musculoskeletal: Normal range of motion. He exhibits no deformity.  Neurological: He is alert and oriented to person, place, and time.  Skin: Skin is warm and dry. Capillary refill takes less than 2 seconds.   RLQ surgical incision has surrounding ecchymosis without tenderness. No erythema, edema, warmth, fluctuance, tenderness to other abdominal surgical scars.   Psychiatric: He has a normal mood and affect. His behavior is normal. Judgment and thought content normal.  Nursing note and vitals reviewed.    ED Treatments / Results  Labs (all labs ordered are listed, but only abnormal results are displayed) Labs Reviewed  COMPREHENSIVE METABOLIC PANEL - Abnormal; Notable for the following components:      Result Value   Glucose, Bld 107 (*)    Creatinine, Ser 1.53 (*)    GFR calc non Af Amer 53 (*)    All other components within normal limits  CBC - Abnormal; Notable for the following components:   MCV 76.6 (*)    All other components within normal limits  URINALYSIS, ROUTINE W REFLEX MICROSCOPIC -  Abnormal; Notable for the following components:   APPearance HAZY (*)    Hgb urine dipstick MODERATE (*)    Ketones, ur 5 (*)    Protein, ur 30 (*)    Nitrite POSITIVE (*)    Leukocytes, UA MODERATE (*)    Bacteria, UA RARE (*)    Squamous Epithelial / LPF 0-5 (*)    All other components within normal limits  LIPASE, BLOOD    EKG  EKG Interpretation None       Radiology No results found.  Procedures Procedures (including critical care time)  Medications Ordered in ED Medications  ondansetron (ZOFRAN-ODT) disintegrating tablet 4 mg (4 mg Oral Given 02/05/18 0136)     Initial Impression / Assessment and Plan / ED Course  I have reviewed the triage vital signs and the nursing notes.  Pertinent labs & imaging results that were available during my care of the patient were reviewed by me and considered in my medical decision making (see chart for details).  Clinical Course as of Feb 05 747  Thu Feb 05, 2018  8563 Creatinine: (!) 1.53 [CG]  0618 Hgb urine dipstick: (!) MODERATE [CG]  1497 Nitrite: (!) POSITIVE [CG]  0618 Leukocytes, UA: (!) MODERATE [CG]  0618 WBC, UA: TOO  NUMEROUS TO COUNT [CG]    Clinical Course User Index [CG] Kinnie Feil, PA-C   47 year old male here for transient, now resolved, left lateral mid abdominal pain around surgical incision. Had one episode of emesis that he attributes to pain. On my exam, patient states pain has resolved. He did take 100 mg of tramadol last night.  Exam is reassuring. Abdomen is soft. He points to left lateral abdominal surgical incision area and states this is where the pain was however he no longer has pain there on palpation. This area is without erythema, edema, signs of infection, it is soft. There is no ecchymosis. No peritonitis. Abdominal exam otherwise reassuring.  Exam is not consistent with hematoma, abscess/cellulitis, incision dehiscence, pyelo, kidney stone. He has no systemic symptoms of infection. No diarrhea or constipation. Doubt SBO, diverticulitis.   Urine looks infected. He already has prescription for Bactrim from urology and is supposed to start it today. [facility] and spoke to nurse practitioner on call. Discussed plan to manage pain and urinary tract infection as outpatient. Patient has scheduled appointment to follow up with urology tomorrow at 11 AM. Neurology nurse practitioner agrees with this plan. Gave patient strict return precautions, his wife is a Marine scientist and she is aware of symptoms that would warrant return to the ED.  Final Clinical Impressions(s) / ED Diagnoses   Final diagnoses:  Acute cystitis with hematuria  Foley catheter in place    ED Discharge Orders        Ordered    oxyCODONE-acetaminophen (PERCOCET/ROXICET) 5-325 MG tablet  Every 4 hours PRN     02/05/18 0645    ondansetron (ZOFRAN ODT) 4 MG disintegrating tablet  Every 8 hours PRN     02/05/18 0645       Kinnie Feil, PA-C 02/05/18 0263    Varney Biles, MD 02/05/18 [phone]

## 2018-02-05 NOTE — ED Notes (Signed)
Bed: WTR9 Expected date:  Expected time:  Means of arrival:  Comments: 

## 2018-02-05 NOTE — ED Triage Notes (Signed)
Patient complaining of left abdominal pain. Patient states he had prostate surgery on 30 Jan 2018. Patient states that around 6pm, he started vomiting and is having nausea. Patient states that he is a lot of pain. Patient took his regular pain medication and it is not working.

## 2018-02-05 NOTE — Discharge Instructions (Signed)
Your urine looked infected today. This is likely from recent surgery, manipulation, foley. Start taking bactrim as prescribed. Stay well hydrated. Take percocet for pain. You can take 1000 mg tylenol every 6-8 hours for additional pain control. Do not exceed 4,000 mg tylenol in 24 hours. Note that percoceet has 325 mg tylenol. I suspect your pain may have been related to surgery, inflammation.   I called [facility] on call nurse practicioner Jimmey Ralph) who is aware you came to the emergency department. She recommends close follow up tomorrow as scheduled. Return to the emergency department for worsening pain, redness, tenderness to surgical incision, fevers, chills, vomiting, diarrhea, constipation, flank pain.

## 2018-02-06 DIAGNOSIS — C61 Malignant neoplasm of prostate: Secondary | ICD-10-CM | POA: Diagnosis not present

## 2018-02-06 DIAGNOSIS — R1084 Generalized abdominal pain: Secondary | ICD-10-CM | POA: Diagnosis not present

## 2018-02-27 DIAGNOSIS — M62838 Other muscle spasm: Secondary | ICD-10-CM | POA: Diagnosis not present

## 2018-02-27 DIAGNOSIS — M6281 Muscle weakness (generalized): Secondary | ICD-10-CM | POA: Diagnosis not present

## 2018-03-23 DIAGNOSIS — Z8546 Personal history of malignant neoplasm of prostate: Secondary | ICD-10-CM | POA: Diagnosis not present

## 2018-05-29 DIAGNOSIS — R82998 Other abnormal findings in urine: Secondary | ICD-10-CM | POA: Diagnosis not present

## 2018-05-29 DIAGNOSIS — Z0000 Encounter for general adult medical examination without abnormal findings: Secondary | ICD-10-CM | POA: Diagnosis not present

## 2018-05-29 DIAGNOSIS — M10072 Idiopathic gout, left ankle and foot: Secondary | ICD-10-CM | POA: Diagnosis not present

## 2018-06-05 DIAGNOSIS — Z0000 Encounter for general adult medical examination without abnormal findings: Secondary | ICD-10-CM | POA: Diagnosis not present

## 2018-06-05 DIAGNOSIS — R1032 Left lower quadrant pain: Secondary | ICD-10-CM | POA: Diagnosis not present

## 2018-06-05 DIAGNOSIS — D509 Iron deficiency anemia, unspecified: Secondary | ICD-10-CM | POA: Diagnosis not present

## 2018-06-05 DIAGNOSIS — Z1389 Encounter for screening for other disorder: Secondary | ICD-10-CM | POA: Diagnosis not present

## 2018-06-05 DIAGNOSIS — D508 Other iron deficiency anemias: Secondary | ICD-10-CM | POA: Diagnosis not present

## 2018-06-05 DIAGNOSIS — N182 Chronic kidney disease, stage 2 (mild): Secondary | ICD-10-CM | POA: Diagnosis not present

## 2018-06-11 DIAGNOSIS — Z1212 Encounter for screening for malignant neoplasm of rectum: Secondary | ICD-10-CM | POA: Diagnosis not present

## 2018-06-18 DIAGNOSIS — K635 Polyp of colon: Secondary | ICD-10-CM | POA: Diagnosis not present

## 2018-06-18 DIAGNOSIS — D126 Benign neoplasm of colon, unspecified: Secondary | ICD-10-CM | POA: Diagnosis not present

## 2018-06-18 DIAGNOSIS — Z1211 Encounter for screening for malignant neoplasm of colon: Secondary | ICD-10-CM | POA: Diagnosis not present

## 2018-06-18 DIAGNOSIS — K621 Rectal polyp: Secondary | ICD-10-CM | POA: Diagnosis not present

## 2018-06-18 DIAGNOSIS — D509 Iron deficiency anemia, unspecified: Secondary | ICD-10-CM | POA: Diagnosis not present

## 2018-06-18 DIAGNOSIS — D125 Benign neoplasm of sigmoid colon: Secondary | ICD-10-CM | POA: Diagnosis not present

## 2018-06-18 DIAGNOSIS — D129 Benign neoplasm of anus and anal canal: Secondary | ICD-10-CM | POA: Diagnosis not present

## 2018-06-18 DIAGNOSIS — D124 Benign neoplasm of descending colon: Secondary | ICD-10-CM | POA: Diagnosis not present

## 2018-06-18 DIAGNOSIS — R195 Other fecal abnormalities: Secondary | ICD-10-CM | POA: Diagnosis not present

## 2018-06-22 DIAGNOSIS — D124 Benign neoplasm of descending colon: Secondary | ICD-10-CM | POA: Diagnosis not present

## 2018-06-22 DIAGNOSIS — K621 Rectal polyp: Secondary | ICD-10-CM | POA: Diagnosis not present

## 2018-06-22 DIAGNOSIS — K635 Polyp of colon: Secondary | ICD-10-CM | POA: Diagnosis not present

## 2018-07-08 DIAGNOSIS — H6692 Otitis media, unspecified, left ear: Secondary | ICD-10-CM | POA: Diagnosis not present

## 2018-07-08 DIAGNOSIS — I10 Essential (primary) hypertension: Secondary | ICD-10-CM | POA: Diagnosis not present

## 2018-07-08 DIAGNOSIS — J329 Chronic sinusitis, unspecified: Secondary | ICD-10-CM | POA: Diagnosis not present

## 2018-10-21 DIAGNOSIS — C61 Malignant neoplasm of prostate: Secondary | ICD-10-CM | POA: Diagnosis not present

## 2018-10-27 DIAGNOSIS — Z8546 Personal history of malignant neoplasm of prostate: Secondary | ICD-10-CM | POA: Diagnosis not present

## 2019-01-27 DIAGNOSIS — C61 Malignant neoplasm of prostate: Secondary | ICD-10-CM | POA: Diagnosis not present

## 2019-04-21 DIAGNOSIS — C61 Malignant neoplasm of prostate: Secondary | ICD-10-CM | POA: Diagnosis not present

## 2019-04-30 DIAGNOSIS — N393 Stress incontinence (female) (male): Secondary | ICD-10-CM | POA: Diagnosis not present

## 2019-04-30 DIAGNOSIS — Z8546 Personal history of malignant neoplasm of prostate: Secondary | ICD-10-CM | POA: Diagnosis not present

## 2021-02-21 ENCOUNTER — Ambulatory Visit: Payer: 59 | Admitting: Podiatry

## 2021-02-21 ENCOUNTER — Ambulatory Visit (INDEPENDENT_AMBULATORY_CARE_PROVIDER_SITE_OTHER): Payer: 59

## 2021-02-21 ENCOUNTER — Other Ambulatory Visit: Payer: Self-pay

## 2021-02-21 ENCOUNTER — Encounter: Payer: Self-pay | Admitting: Podiatry

## 2021-02-21 DIAGNOSIS — M79671 Pain in right foot: Secondary | ICD-10-CM | POA: Diagnosis not present

## 2021-02-21 DIAGNOSIS — M79672 Pain in left foot: Secondary | ICD-10-CM

## 2021-02-21 DIAGNOSIS — M7672 Peroneal tendinitis, left leg: Secondary | ICD-10-CM

## 2021-02-21 DIAGNOSIS — M7671 Peroneal tendinitis, right leg: Secondary | ICD-10-CM | POA: Diagnosis not present

## 2021-02-21 MED ORDER — TRIAMCINOLONE ACETONIDE 10 MG/ML IJ SUSP
10.0000 mg | Freq: Once | INTRAMUSCULAR | Status: AC
  Administered 2021-02-21: 10 mg

## 2021-02-25 NOTE — Progress Notes (Signed)
Subjective:   Patient ID: George Yang, male   DOB: 50 y.o.   MRN: [id number]   HPI Patient presents with bilateral aching feet in both feet both underneath and outside.  States that his flatfeet are a bother for him at all times.  Patient does not smoke and is active   Review of Systems  All other systems reviewed and are negative.       Objective:  Physical Exam Vitals and nursing note reviewed.  Constitutional:      Appearance: He is well-developed and well-nourished.  Cardiovascular:     Pulses: Intact distal pulses.  Pulmonary:     Effort: Pulmonary effort is normal.  Musculoskeletal:        General: Normal range of motion.  Skin:    General: Skin is warm.  Neurological:     Mental Status: He is alert.     Neurovascular status intact muscle strength was found to be adequate range of motion adequate.  Patient is noted to have significant flatfoot deformity bilateral with mild discomfort in the lateral side of the foot and quite a bit of discomfort in the arch bilateral secondary to stress on posterior tibial tendon.  Moderate sinus tarsitis also noted bilateral with patient having good digital perfusion well oriented x3.  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  We can bill them up and I encouraged the patient that this will need to be done and patient wants orthotics and is casted for functional orthotic devices.  Reappoint when ready.  I did go ahead did sterile prep injected the insertion peroneal into the base of fifth metatarsal with 3 mg Dexasone Kenalog 5 mg Xylocaine  X-rays indicate there is flatfoot deformity bilateral no indications of coalition or advanced arthritis

## 2021-03-21 ENCOUNTER — Other Ambulatory Visit: Payer: Self-pay

## 2021-03-21 ENCOUNTER — Ambulatory Visit (INDEPENDENT_AMBULATORY_CARE_PROVIDER_SITE_OTHER): Payer: 59 | Admitting: *Deleted

## 2021-03-21 DIAGNOSIS — M7671 Peroneal tendinitis, right leg: Secondary | ICD-10-CM

## 2021-03-21 NOTE — Progress Notes (Signed)
Patient presents today to pick up custom molded foot orthotics, diagnosed with peroneal tendonitis by Dr. Paulla Dolly.   Orthotics were dispensed and fit was satisfactory. Reviewed instructions for break-in and wear. Written instructions given to patient.  Patient will follow up as needed.   [facility] - order # [id number]
